# Patient Record
Sex: Male | Born: 1970
Health system: Southern US, Community
[De-identification: ages and names within clinical notes are randomized; demographics above are authoritative.]

## PROBLEM LIST (undated history)

## (undated) DIAGNOSIS — E785 Hyperlipidemia, unspecified: Secondary | ICD-10-CM

## (undated) DIAGNOSIS — K219 Gastro-esophageal reflux disease without esophagitis: Secondary | ICD-10-CM

## (undated) DIAGNOSIS — E119 Type 2 diabetes mellitus without complications: Secondary | ICD-10-CM

## (undated) HISTORY — DX: Type 2 diabetes mellitus without complications: E11.9

## (undated) HISTORY — PX: APPENDECTOMY: SHX54

## (undated) HISTORY — DX: Gastro-esophageal reflux disease without esophagitis: K21.9

## (undated) HISTORY — DX: Hyperlipidemia, unspecified: E78.5

## (undated) HISTORY — PX: VASECTOMY: SHX75

---

## 2004-11-06 ENCOUNTER — Ambulatory Visit: Payer: Self-pay | Admitting: Family Medicine

## 2010-07-18 ENCOUNTER — Emergency Department (HOSPITAL_BASED_OUTPATIENT_CLINIC_OR_DEPARTMENT_OTHER)
Admission: EM | Admit: 2010-07-18 | Discharge: 2010-07-18 | Disposition: A | Discharge status: Home / Self Care | Payer: PRIVATE HEALTH INSURANCE | Attending: Emergency Medicine | Admitting: Emergency Medicine

## 2010-07-18 ENCOUNTER — Emergency Department (INDEPENDENT_AMBULATORY_CARE_PROVIDER_SITE_OTHER): Payer: PRIVATE HEALTH INSURANCE

## 2010-07-18 DIAGNOSIS — R509 Fever, unspecified: Secondary | ICD-10-CM

## 2010-07-18 DIAGNOSIS — Z87891 Personal history of nicotine dependence: Secondary | ICD-10-CM

## 2010-07-18 DIAGNOSIS — R059 Cough, unspecified: Secondary | ICD-10-CM

## 2010-07-18 DIAGNOSIS — R05 Cough: Secondary | ICD-10-CM

## 2010-07-18 DIAGNOSIS — J189 Pneumonia, unspecified organism: Secondary | ICD-10-CM | POA: Insufficient documentation

## 2010-07-18 LAB — BASIC METABOLIC PANEL
BUN: 17 mg/dL (ref 6–23)
CO2: 20 mEq/L (ref 19–32)
Calcium: 9.4 mg/dL (ref 8.4–10.5)
Chloride: 101 mEq/L (ref 96–112)
Creatinine, Ser: 0.8 mg/dL (ref 0.4–1.5)
GFR calc Af Amer: >60 mL/min (ref >60)
GFR calc non Af Amer: >60 mL/min (ref >60)
Glucose, Bld: 134 mg/dL — ABNORMAL HIGH (ref 70–99)
Potassium: 3.6 mEq/L (ref 3.5–5.1)
Sodium: 136 mEq/L (ref 135–145)

## 2010-07-18 LAB — DIFFERENTIAL
Basophils Absolute: 0 10*3/uL (ref 0.0–0.1)
Basophils Relative: 0 % (ref 0–1)
Eosinophils Absolute: 0 10*3/uL (ref 0.0–0.7)
Eosinophils Relative: 0 % (ref 0–5)
Lymphocytes Relative: 10 % — ABNORMAL LOW (ref 12–46)
Lymphs Abs: 0.7 10*3/uL (ref 0.7–4.0)
Monocytes Absolute: 0.3 10*3/uL (ref 0.1–1.0)
Monocytes Relative: 4 % (ref 3–12)
Neutro Abs: 6 10*3/uL (ref 1.7–7.7)
Neutrophils Relative %: 85 % — ABNORMAL HIGH (ref 43–77)

## 2010-07-18 LAB — CBC
HCT: 47.3 % (ref 39.0–52.0)
Hemoglobin: 16.5 g/dL (ref 13.0–17.0)
MCH: 30.1 pg (ref 26.0–34.0)
MCHC: 34.9 g/dL (ref 30.0–36.0)
MCV: 86.2 fL (ref 78.0–100.0)
Platelets: 221 10*3/uL (ref 150–400)
RBC: 5.49 MIL/uL (ref 4.22–5.81)
RDW: 12.9 % (ref 11.5–15.5)
WBC: 7 10*3/uL (ref 4.0–10.5)

## 2011-01-31 ENCOUNTER — Emergency Department (HOSPITAL_BASED_OUTPATIENT_CLINIC_OR_DEPARTMENT_OTHER)
Admission: EM | Admit: 2011-01-31 | Discharge: 2011-02-01 | Disposition: A | Discharge status: Home / Self Care | Payer: PRIVATE HEALTH INSURANCE | Attending: Emergency Medicine | Admitting: Emergency Medicine

## 2011-01-31 ENCOUNTER — Emergency Department (INDEPENDENT_AMBULATORY_CARE_PROVIDER_SITE_OTHER): Payer: PRIVATE HEALTH INSURANCE

## 2011-01-31 ENCOUNTER — Encounter: Payer: Self-pay | Admitting: *Deleted

## 2011-01-31 DIAGNOSIS — R079 Chest pain, unspecified: Secondary | ICD-10-CM

## 2011-01-31 DIAGNOSIS — F17200 Nicotine dependence, unspecified, uncomplicated: Secondary | ICD-10-CM | POA: Insufficient documentation

## 2011-01-31 DIAGNOSIS — X58XXXA Exposure to other specified factors, initial encounter: Secondary | ICD-10-CM | POA: Insufficient documentation

## 2011-01-31 DIAGNOSIS — S299XXA Unspecified injury of thorax, initial encounter: Secondary | ICD-10-CM

## 2011-01-31 DIAGNOSIS — S2341XA Sprain of ribs, initial encounter: Secondary | ICD-10-CM | POA: Insufficient documentation

## 2011-01-31 NOTE — ED Notes (Signed)
Pt reports left side rib pain- pain started today around 1200 after pt coughed- ribs sore to touch

## 2011-02-01 MED ORDER — OXYCODONE-ACETAMINOPHEN 5-325 MG PO TABS
1.0000 | ORAL_TABLET | Freq: Once | ORAL | Status: AC
  Administered 2011-02-01: 1 via ORAL
  Filled 2011-02-01: qty 1

## 2011-02-01 MED ORDER — OXYCODONE HCL 10 MG PO TB12: ORAL_TABLET | ORAL | Status: DC

## 2011-02-01 NOTE — ED Provider Notes (Signed)
History     CSN: [id number]  Arrival date & time 01/31/11  2240   First MD Initiated Contact with Patient 02/01/11 [phone]      Chief Complaint  Patient presents with  . rib pain     (Consider location/radiation/quality/duration/timing/severity/associated sxs/prior treatment) HPI Is a 40 year old white male who was coughing to clear his throat yesterday about noon. He felt the sudden onset of a sharp, well localized pain in the left lower rib region. He points to an area about the anterior axillary line. He has had no symptoms of illness. He states he was coughing to clear his throat and has not been otherwise coughing. He states the pain is mild at rest but if he coughs or sneezes the pain is severe and feels like a knife being stabbed into his chest. He is not short of breath.  History reviewed. No pertinent past medical history.  Past Surgical History  Procedure Date  . Appendectomy     No family history on file.  History  Substance Use Topics  . Smoking status: Current Everyday Smoker -- 1.0 packs/day  . Smokeless tobacco: Never Used  . Alcohol Use: No      Review of Systems  All other systems reviewed and are negative.    Allergies  Review of patient's allergies indicates no known allergies.  Home Medications   Current Outpatient Rx  Name Route Sig Dispense Refill  . IBUPROFEN 200 MG PO TABS Oral Take 800 mg by mouth every 6 (six) hours as needed. For pain     . OMEPRAZOLE MAGNESIUM 20 MG PO TBEC Oral Take 20 mg by mouth daily as needed. For acid reflux       BP 156/83  Pulse 102  Temp(Src) 98.1 F (36.7 C) (Oral)  Resp 20  Ht 5\' 10"  (1.778 m)  Wt 220 lb (99.791 kg)  BMI 31.57 kg/m2  SpO2 96%  Physical Exam General: Well-developed, well-nourished male in no acute distress; appearance consistent with age of record HENT: normocephalic, atraumatic Eyes: Normal Neck: supple Heart: regular rate and rhythm Lungs: clear to auscultation  bilaterally Chest: Point tenderness left lower rib cage along the midaxillary line; no deformity or crepitus Abdomen: soft; nondistended Extremities: No deformity; full range of motion Neurologic: Awake, alert and oriented; motor function intact in all extremities and symmetric; no facial droop Skin: Warm and dry Psychiatric: Normal mood and affect    ED Course  Procedures (including critical care time)    MDM   Nursing notes and vitals signs, including pulse oximetry, reviewed.  Summary of this visit's results, reviewed by myself:   Imaging Studies: Dg Chest 2 View  01/31/2011  *RADIOLOGY REPORT*  Clinical Data: Left-sided chest pain.  CHEST - 2 VIEW  Comparison: Chest radiograph performed 07/18/2010  Findings: The lungs are well-aerated.  Mild chronic peribronchial thickening is noted.  There is no evidence of focal opacification, pleural effusion or pneumothorax.  The heart is borderline normal in size; the mediastinal contour is within normal limits.  No acute osseous abnormalities are seen.  IMPRESSION: Mild chronic peribronchial thickening noted; lungs otherwise grossly clear.  Original Report Authenticated By: Tonia Ghent, M.D.   2:20 AM Suspect occult fracture along the costochondral junction of a left lower rib.         Hanley Seamen, MD 02/01/11 [phone]

## 2013-04-12 IMAGING — CR DG CHEST 2V
2 series · 2 of 2 positions shown · non-contrast
Comparison: None.

CLINICAL DATA: Cough, fever, smoking history

CHEST - 2 VIEW

[w chest pa]
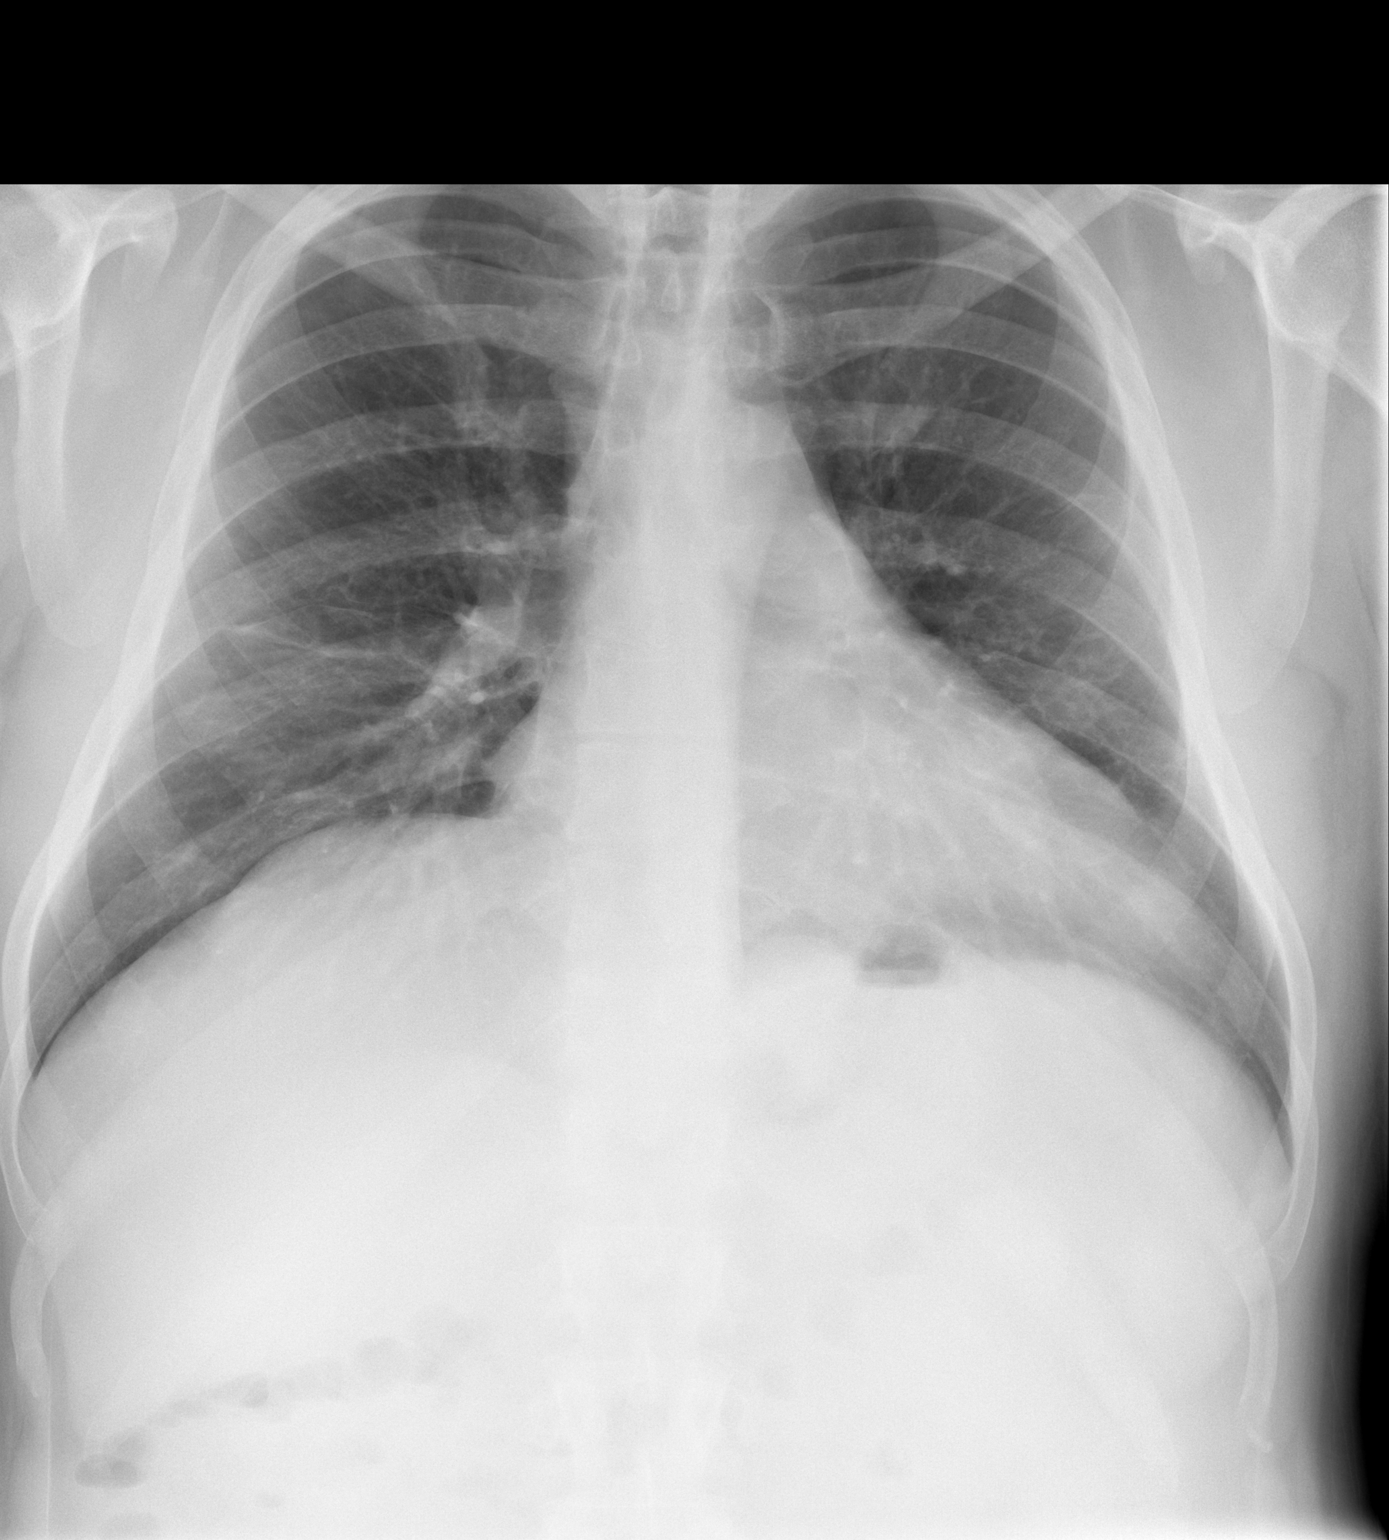

[w chest lat]
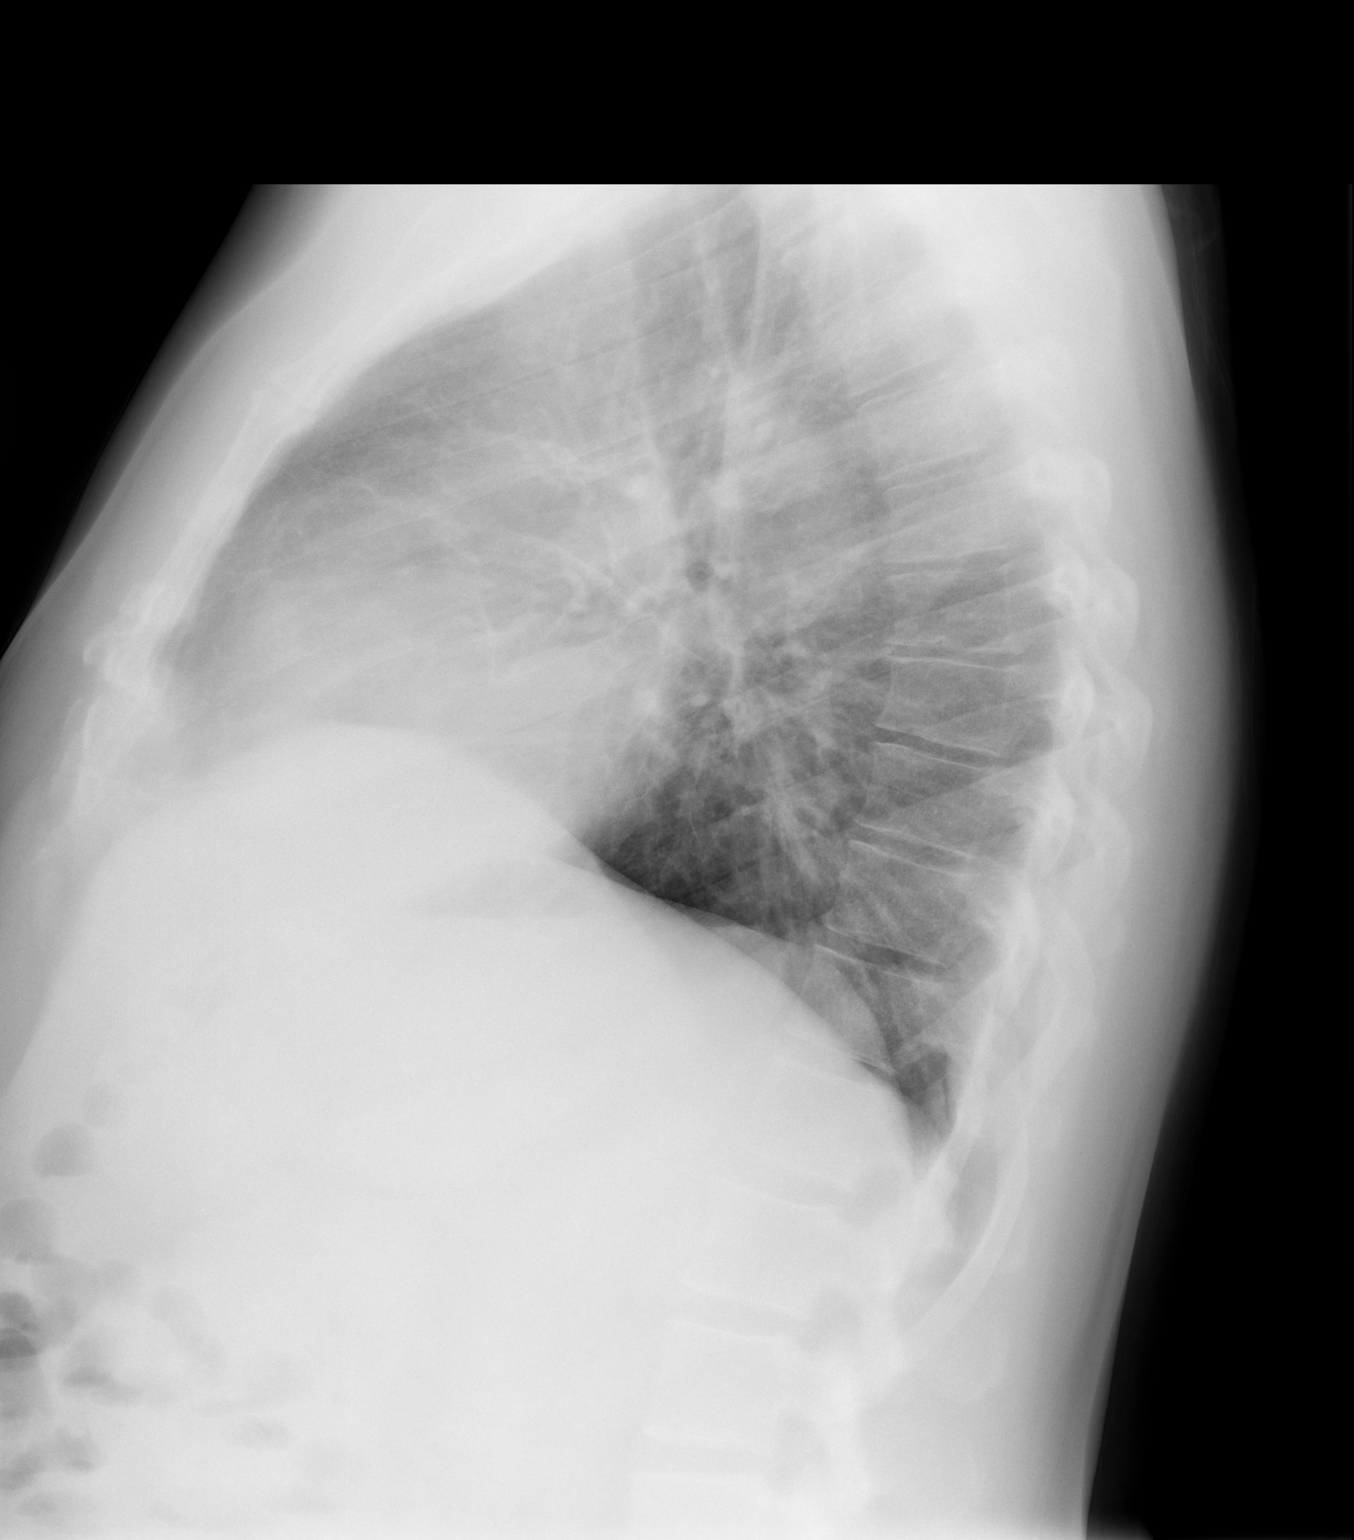

[2 of 2 positions shown; findings below may reference images not displayed]

FINDINGS: No pneumonia or effusion is seen.  Linear atelectasis or
scarring is noted at the right lung base.  There is some
peribronchial thickening which may indicate bronchitis.  The heart
is mildly enlarged.  No bony abnormality is seen.
IMPRESSION: No pneumonia.  Peribronchial thickening may indicate bronchitis.

## 2017-05-11 ENCOUNTER — Encounter: Payer: Self-pay | Admitting: Physician Assistant

## 2017-05-11 ENCOUNTER — Ambulatory Visit: Payer: 59 | Admitting: Physician Assistant

## 2017-05-11 ENCOUNTER — Other Ambulatory Visit: Payer: Self-pay | Admitting: Physician Assistant

## 2017-05-11 VITALS — BP 130/72 | HR 112 | Temp 97.2°F | Ht 70.0 in | Wt 187.8 lb

## 2017-05-11 DIAGNOSIS — Z0000 Encounter for general adult medical examination without abnormal findings: Secondary | ICD-10-CM

## 2017-05-11 DIAGNOSIS — R7309 Other abnormal glucose: Secondary | ICD-10-CM

## 2017-05-11 LAB — CBC WITH DIFFERENTIAL/PLATELET
Basophils Absolute: 0.1 10*3/uL (ref 0.0–0.2)
Basos: 1 %
EOS (ABSOLUTE): 0.1 10*3/uL (ref 0.0–0.4)
Eos: 2 %
Hematocrit: 48.5 % (ref 37.5–51.0)
Hemoglobin: 16.4 g/dL (ref 13.0–17.7)
Immature Grans (Abs): 0 10*3/uL (ref 0.0–0.1)
Immature Granulocytes: 0 %
Lymphocytes Absolute: 2.4 10*3/uL (ref 0.7–3.1)
Lymphs: 36 %
MCH: 30.3 pg (ref 26.6–33.0)
MCHC: 33.8 g/dL (ref 31.5–35.7)
MCV: 90 fL (ref 79–97)
Monocytes Absolute: 0.5 10*3/uL (ref 0.1–0.9)
Monocytes: 7 %
Neutrophils Absolute: 3.6 10*3/uL (ref 1.4–7.0)
Neutrophils: 54 %
Platelets: 195 10*3/uL (ref 150–379)
RBC: 5.42 x10E6/uL (ref 4.14–5.80)
RDW: 13.8 % (ref 12.3–15.4)
WBC: 6.7 10*3/uL (ref 3.4–10.8)

## 2017-05-11 LAB — LIPID PANEL
Chol/HDL Ratio: 6.4 ratio — ABNORMAL HIGH (ref 0.0–5.0)
Cholesterol, Total: 257 mg/dL — ABNORMAL HIGH (ref 100–199)
HDL: 40 mg/dL (ref >39)
LDL Calculated: 169 mg/dL — ABNORMAL HIGH (ref 0–99)
Triglycerides: 238 mg/dL — ABNORMAL HIGH (ref 0–149)
VLDL Cholesterol Cal: 48 mg/dL — ABNORMAL HIGH (ref 5–40)

## 2017-05-11 LAB — CMP14+EGFR
ALT: 22 IU/L (ref 0–44)
AST: 15 IU/L (ref 0–40)
Albumin/Globulin Ratio: 1.7 (ref 1.2–2.2)
Albumin: 4.5 g/dL (ref 3.5–5.5)
Alkaline Phosphatase: 62 IU/L (ref 39–117)
BUN/Creatinine Ratio: 12 (ref 9–20)
BUN: 10 mg/dL (ref 6–24)
Bilirubin Total: 0.4 mg/dL (ref 0.0–1.2)
CO2: 21 mmol/L (ref 20–29)
Calcium: 10.6 mg/dL — ABNORMAL HIGH (ref 8.7–10.2)
Chloride: 99 mmol/L (ref 96–106)
Creatinine, Ser: 0.84 mg/dL (ref 0.76–1.27)
GFR calc Af Amer: 121 mL/min/{1.73_m2} (ref >59)
GFR calc non Af Amer: 104 mL/min/{1.73_m2} (ref >59)
Globulin, Total: 2.6 g/dL (ref 1.5–4.5)
Glucose: 320 mg/dL — ABNORMAL HIGH (ref 65–99)
Potassium: 4.5 mmol/L (ref 3.5–5.2)
Sodium: 139 mmol/L (ref 134–144)
Total Protein: 7.1 g/dL (ref 6.0–8.5)

## 2017-05-11 LAB — BAYER DCA HB A1C WAIVED: HB A1C (BAYER DCA - WAIVED): 12.3 % — ABNORMAL HIGH (ref <7.0)

## 2017-05-11 MED ORDER — SITAGLIPTIN PHOS-METFORMIN HCL 50-1000 MG PO TABS: 1.0000 | ORAL_TABLET | Freq: Two times a day (BID) | ORAL | 2 refills | Status: DC

## 2017-05-11 MED ORDER — CEPHALEXIN 500 MG PO CAPS: 500.0000 mg | ORAL_CAPSULE | Freq: Three times a day (TID) | ORAL | 0 refills | Status: DC

## 2017-05-11 NOTE — Patient Instructions (Signed)
In a few days you may receive a survey in the mail or online from [employer] regarding your visit with us today. Please take a moment to fill this out. Your feedback is very important to our whole office. It can help us better understand your needs as well as improve your experience and satisfaction. Thank you for taking your time to complete it. We care about you.  Raelyn Racette, PA-C  

## 2017-05-11 NOTE — Progress Notes (Signed)
BP 130/72   Pulse (!) 112   Temp (!) 97.2 F (36.2 C) (Oral)   Ht _0  (1.778 m)   Wt 187 lb 12.8 oz (85.2 kg)   BMI 26.95 kg/m    Subjective:    Patient ID: Craig Allison, male    DOB: 06/17/1970, 47 y.o.   MRN: [id number]  HPI: Craig Allison is a 47 y.o. 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  The provider only gave him a 65-monthcard it will run out April 7.  He does need lab work performed to check on his glucose status.  He has never been diagnosed with diabetes is being health problem is GERD.  He has been trying to watch his diet and exercise.  And he has persistent slightly elevated heart rate around the 100s.   Past Medical History:  Diagnosis Date  . GERD (gastroesophageal reflux disease)    Relevant past medical, surgical, family and social history reviewed and updated as indicated. Interim medical history since our last visit reviewed. Allergies and medications reviewed and updated. DATA REVIEWED: CHART IN EPIC  Family History reviewed for pertinent findings.  Review of Systems  Constitutional: Negative.  Negative for appetite change and fatigue.  HENT: Negative.   Eyes: Negative.  Negative for pain and visual disturbance.  Respiratory: Negative.  Negative for cough, chest tightness, shortness of breath and wheezing.   Cardiovascular: Negative.  Negative for chest pain, palpitations and leg swelling.  Gastrointestinal: Negative.  Negative for abdominal pain, diarrhea, nausea and vomiting.  Endocrine: Negative.   Genitourinary: Negative.   Musculoskeletal: Negative.   Skin: Negative.  Negative for color change and rash.  Neurological: Negative.  Negative for weakness, numbness and headaches.  Psychiatric/Behavioral: Negative.     Allergies as of 05/11/2017   No Known Allergies     Medication List        Accurate as of 05/11/17   1:51 PM. Always use your most recent med list.          cephALEXin 500 MG capsule Commonly known as:  KEFLEX Take 1 capsule (500 mg total) by mouth 3 (three) times daily.   omeprazole 20 MG tablet Commonly known as:  PRILOSEC OTC Take 20 mg by mouth daily as needed. For acid reflux          Objective:    BP 130/72   Pulse (!) 112   Temp (!) 97.2 F (36.2 C) (Oral)   Ht _1  (1.778 m)   Wt 187 lb 12.8 oz (85.2 kg)   BMI 26.95 kg/m   No Known Allergies  Wt Readings from Last 3 Encounters:  05/11/17 187 lb 12.8 oz (85.2 kg)  01/31/11 220 lb (99.8 kg)    Physical Exam  Constitutional: He appears well-developed and well-nourished.  HENT:  Head: Normocephalic and atraumatic.  Eyes: Pupils are equal, round, and reactive to light. Conjunctivae and EOM are normal.  Neck: Normal range of motion. Neck supple.  Cardiovascular: Normal rate, regular rhythm and normal heart sounds.  Pulmonary/Chest: Effort normal and breath sounds normal.  Abdominal: Soft. Bowel sounds are normal.  Musculoskeletal: Normal range of motion.  Skin: Skin is warm and dry.  Nursing note and vitals reviewed.   Results for orders placed or performed in visit on 05/11/17  Bayer DCA Hb A1c Waived  Result Value  Ref Range   Bayer DCA Hb A1c Waived 12.3 (H) <7.0 %      Assessment & Plan:   1. Well adult exam - Bayer DCA Hb A1c Waived - CBC with Differential/Platelet - CMP14+EGFR - Lipid panel  2. Elevated glucose - Bayer DCA Hb A1c Waived   Continue all other maintenance medications as listed above.  Follow up plan: Return in about 3 months (around 08/10/2017) for recheck.  Educational handout given for [place] PA-C [place] [address]  [place], [place] [address] [phone]   05/11/2017, 1:51 PM

## 2017-05-20 ENCOUNTER — Ambulatory Visit: Payer: 59 | Admitting: Nurse Practitioner

## 2017-05-20 ENCOUNTER — Encounter: Payer: Self-pay | Admitting: Nurse Practitioner

## 2017-05-20 DIAGNOSIS — Z024 Encounter for examination for driving license: Secondary | ICD-10-CM

## 2017-05-20 NOTE — Addendum Note (Signed)
Addended by: Bennie PieriniMARTIN, MARY-MARGARET on: 05/20/2017 04:04 PM   Modules accepted: Level of Service

## 2017-05-20 NOTE — Progress Notes (Signed)
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

## 2017-06-11 ENCOUNTER — Encounter: Payer: Self-pay | Admitting: Physician Assistant

## 2017-06-11 ENCOUNTER — Ambulatory Visit: Payer: 59 | Admitting: Physician Assistant

## 2017-06-11 DIAGNOSIS — E119 Type 2 diabetes mellitus without complications: Secondary | ICD-10-CM

## 2017-06-11 DIAGNOSIS — E1169 Type 2 diabetes mellitus with other specified complication: Secondary | ICD-10-CM | POA: Insufficient documentation

## 2017-06-11 MED ORDER — SITAGLIPTIN PHOS-METFORMIN HCL 50-1000 MG PO TABS: 1.0000 | ORAL_TABLET | Freq: Two times a day (BID) | ORAL | 1 refills | Status: DC

## 2017-06-11 NOTE — Patient Instructions (Signed)
In a few days you may receive a survey in the mail or online from [employer] regarding your visit with us today. Please take a moment to fill this out. Your feedback is very important to our whole office. It can help us better understand your needs as well as improve your experience and satisfaction. Thank you for taking your time to complete it. We care about you.  Kishawn Pickar, PA-C  

## 2017-06-13 NOTE — Progress Notes (Signed)
BP 112/80   Pulse (!) 102   Ht _0  (1.778 m)   Wt 184 lb 3.2 oz (83.6 kg)   BMI 26.43 kg/m    Subjective:    Patient ID: Craig Allison, male    DOB: 08/11/1970, 47 y.o.   MRN: [id number]  HPI: Craig Allison is a 47 y.o. male presenting on 06/11/2017 for Diabetes (1 month )  This patient comes in for 1 month recheck on his diabetes.  He has had excellent numbers for fasting blood sugar since starting his medication and beginning to watch his diet.  His fastings are between 95 and 115.  The high as he is seeing later in the day is 150.  He has greatly reduced his carb intake.  He is tolerating his medication well.  He is also stopped drinking soda altogether.  I have commended him on his efforts.  We will have him continue and to recheck in 2 months.  We should be able to go on and work on his DOT exam at this time.  Past Medical History:  Diagnosis Date  . GERD (gastroesophageal reflux disease)    Relevant past medical, surgical, family and social history reviewed and updated as indicated. Interim medical history since our last visit reviewed. Allergies and medications reviewed and updated. DATA REVIEWED: CHART IN EPIC  Family History reviewed for pertinent findings.  Review of Systems  Constitutional: Negative.  Negative for appetite change and fatigue.  HENT: Negative.   Eyes: Negative.  Negative for pain and visual disturbance.  Respiratory: Negative.  Negative for cough, chest tightness, shortness of breath and wheezing.   Cardiovascular: Negative.  Negative for chest pain, palpitations and leg swelling.  Gastrointestinal: Negative.  Negative for abdominal pain, diarrhea, nausea and vomiting.  Endocrine: Negative.   Genitourinary: Negative.   Musculoskeletal: Negative.   Skin: Negative.  Negative for color change and rash.  Neurological: Negative.  Negative for weakness, numbness and headaches.  Psychiatric/Behavioral: Negative.     Allergies as of 06/11/2017     No Known Allergies     Medication List        Accurate as of 06/11/17 11:59 PM. Always use your most recent med list.          omeprazole 20 MG tablet Commonly known as:  PRILOSEC OTC Take 20 mg by mouth daily as needed. For acid reflux   sitaGLIPtin-metformin 50-1000 MG tablet Commonly known as:  JANUMET Take 1 tablet by mouth 2 (two) times daily with a meal.          Objective:    BP 112/80   Pulse (!) 102   Ht _1  (1.778 m)   Wt 184 lb 3.2 oz (83.6 kg)   BMI 26.43 kg/m   No Known Allergies  Wt Readings from Last 3 Encounters:  06/11/17 184 lb 3.2 oz (83.6 kg)  05/11/17 187 lb 12.8 oz (85.2 kg)  01/31/11 220 lb (99.8 kg)    Physical Exam  Constitutional: He appears well-developed and well-nourished. No distress.  HENT:  Head: Normocephalic and atraumatic.  Eyes: Pupils are equal, round, and reactive to light. Conjunctivae and EOM are normal.  Cardiovascular: Normal rate, regular rhythm and normal heart sounds.  Pulmonary/Chest: Effort normal and breath sounds normal. No respiratory distress.  Skin: Skin is warm and dry.  Psychiatric: He has a normal mood and affect. His behavior is normal.  Nursing note and vitals reviewed.   Results for orders  placed or performed in visit on 05/11/17  Bayer DCA Hb A1c Waived  Result Value Ref Range   Bayer DCA Hb A1c Waived 12.3 (H) <7.0 %  CBC with Differential/Platelet  Result Value Ref Range   WBC 6.7 3.4 - 10.8 x10E3/uL   RBC 5.42 4.14 - 5.80 x10E6/uL   Hemoglobin 16.4 13.0 - 17.7 g/dL   Hematocrit 48.5 37.5 - 51.0 %   MCV 90 79 - 97 fL   MCH 30.3 26.6 - 33.0 pg   MCHC 33.8 31.5 - 35.7 g/dL   RDW 13.8 12.3 - 15.4 %   Platelets 195 150 - 379 x10E3/uL   Neutrophils 54 Not Estab. %   Lymphs 36 Not Estab. %   Monocytes 7 Not Estab. %   Eos 2 Not Estab. %   Basos 1 Not Estab. %   Neutrophils Absolute 3.6 1.4 - 7.0 x10E3/uL   Lymphocytes Absolute 2.4 0.7 - 3.1 x10E3/uL   Monocytes Absolute 0.5 0.1 - 0.9  x10E3/uL   EOS (ABSOLUTE) 0.1 0.0 - 0.4 x10E3/uL   Basophils Absolute 0.1 0.0 - 0.2 x10E3/uL   Immature Granulocytes 0 Not Estab. %   Immature Grans (Abs) 0.0 0.0 - 0.1 x10E3/uL  CMP14+EGFR  Result Value Ref Range   Glucose 320 (H) 65 - 99 mg/dL   BUN 10 6 - 24 mg/dL   Creatinine, Ser 0.84 0.76 - 1.27 mg/dL   GFR calc non Af Amer 104 >59 mL/min/1.73   GFR calc Af Amer 121 >59 mL/min/1.73   BUN/Creatinine Ratio 12 9 - 20   Sodium 139 134 - 144 mmol/L   Potassium 4.5 3.5 - 5.2 mmol/L   Chloride 99 96 - 106 mmol/L   CO2 21 20 - 29 mmol/L   Calcium 10.6 (H) 8.7 - 10.2 mg/dL   Total Protein 7.1 6.0 - 8.5 g/dL   Albumin 4.5 3.5 - 5.5 g/dL   Globulin, Total 2.6 1.5 - 4.5 g/dL   Albumin/Globulin Ratio 1.7 1.2 - 2.2   Bilirubin Total 0.4 0.0 - 1.2 mg/dL   Alkaline Phosphatase 62 39 - 117 IU/L   AST 15 0 - 40 IU/L   ALT 22 0 - 44 IU/L  Lipid panel  Result Value Ref Range   Cholesterol, Total 257 (H) 100 - 199 mg/dL   Triglycerides 238 (H) 0 - 149 mg/dL   HDL 40 >39 mg/dL   VLDL Cholesterol Cal 48 (H) 5 - 40 mg/dL   LDL Calculated 169 (H) 0 - 99 mg/dL   Chol/HDL Ratio 6.4 (H) 0.0 - 5.0 ratio      Assessment & Plan:   1. Diabetes mellitus without complication ([place])   Continue all other maintenance medications as listed above.  Follow up plan: Return in about 4 months (around 10/12/2017) for recheck and labs.  Educational handout given for [place] PA-C [place] [address]  [place], [place] [address] [phone]   06/13/2017, 10:28 PM

## 2017-07-21 ENCOUNTER — Ambulatory Visit: Payer: Self-pay

## 2017-07-21 ENCOUNTER — Other Ambulatory Visit: Payer: Self-pay | Admitting: Occupational Medicine

## 2017-07-21 DIAGNOSIS — Z0000 Encounter for general adult medical examination without abnormal findings: Secondary | ICD-10-CM

## 2017-10-27 ENCOUNTER — Ambulatory Visit: Payer: 59 | Admitting: Physician Assistant

## 2017-10-29 ENCOUNTER — Ambulatory Visit: Payer: 59 | Admitting: Physician Assistant

## 2018-02-23 ENCOUNTER — Ambulatory Visit (INDEPENDENT_AMBULATORY_CARE_PROVIDER_SITE_OTHER): Payer: BLUE CROSS/BLUE SHIELD | Admitting: Family Medicine

## 2018-02-23 ENCOUNTER — Encounter: Payer: Self-pay | Admitting: Family Medicine

## 2018-02-23 VITALS — BP 134/78 | HR 103 | Temp 97.7°F | Ht 70.0 in | Wt 198.0 lb

## 2018-02-23 DIAGNOSIS — E0869 Diabetes mellitus due to underlying condition with other specified complication: Secondary | ICD-10-CM

## 2018-02-23 DIAGNOSIS — E785 Hyperlipidemia, unspecified: Secondary | ICD-10-CM | POA: Diagnosis not present

## 2018-02-23 DIAGNOSIS — E1169 Type 2 diabetes mellitus with other specified complication: Secondary | ICD-10-CM

## 2018-02-23 LAB — BAYER DCA HB A1C WAIVED: HB A1C (BAYER DCA - WAIVED): 7.4 % — ABNORMAL HIGH (ref <7.0)

## 2018-02-23 NOTE — Progress Notes (Signed)
BP 134/78   Pulse (!) 103   Temp 97.7 F (36.5 C) (Oral)   Ht '5\' 10"'$  (1.778 m)   Wt 198 lb (89.8 kg)   BMI 28.41 kg/m    Subjective:    Patient ID: Craig Allison, male    DOB: 04/22/1970, 48 y.o.   MRN: [id number]  HPI: Craig Allison is a 49 y.o. male presenting on 02/23/2018 for Diabetes (check up of chronic medical conditions)   HPI Type 2 diabetes mellitus Patient comes in today for recheck of his diabetes. Patient has been currently taking no medication because he stopped his Janumet but he does say he has been using some of his sisters metformin. Patient is not currently on an ACE inhibitor/ARB. Patient has not seen an ophthalmologist this year. Patient denies any issues with their feet.  His last A1c was almost a year ago and it was 10.4 but he says that he has stopped drinking [facility] since that time and thinks his blood sugars are running better  Hyperlipidemia Patient is coming in for recheck of his hyperlipidemia. The patient is currently taking no medication currently but his cholesterol was up last time this was almost a year ago and we will recheck it today.. They deny any issues with myalgias or history of liver damage from it. They deny any focal numbness or weakness or chest pain.   Patient is having some erectile dysfunction, we will monitor his blood work and get his diabetes under control and then we will consider medications after that if he still having a problem.  Relevant past medical, surgical, family and social history reviewed and updated as indicated. Interim medical history since our last visit reviewed. Allergies and medications reviewed and updated.  Review of Systems  Constitutional: Negative for chills and fever.  Respiratory: Negative for shortness of breath and wheezing.   Cardiovascular: Negative for chest pain and leg swelling.  Musculoskeletal: Negative for back pain and gait problem.  Skin: Negative for rash.  Neurological: Negative  for dizziness, weakness and light-headedness.  All other systems reviewed and are negative.   Per HPI unless specifically indicated above   Allergies as of 02/23/2018   No Known Allergies     Medication List       Accurate as of February 23, 2018  2:31 PM. Always use your most recent med list.        omeprazole 20 MG tablet Commonly known as:  PRILOSEC OTC Take 20 mg by mouth daily as needed. For acid reflux          Objective:    BP 134/78   Pulse (!) 103   Temp 97.7 F (36.5 C) (Oral)   Ht '5\' 10"'$  (1.778 m)   Wt 198 lb (89.8 kg)   BMI 28.41 kg/m   Wt Readings from Last 3 Encounters:  02/23/18 198 lb (89.8 kg)  06/11/17 184 lb 3.2 oz (83.6 kg)  05/11/17 187 lb 12.8 oz (85.2 kg)    Physical Exam Vitals signs and nursing note reviewed.  Constitutional:      General: He is not in acute distress.    Appearance: He is well-developed. He is not diaphoretic.  Eyes:     General: No scleral icterus.    Conjunctiva/sclera: Conjunctivae normal.  Neck:     Musculoskeletal: Neck supple.     Thyroid: No thyromegaly.  Cardiovascular:     Rate and Rhythm: Normal rate and regular rhythm.  Heart sounds: Normal heart sounds. No murmur.  Pulmonary:     Effort: Pulmonary effort is normal. No respiratory distress.     Breath sounds: Normal breath sounds. No wheezing.  Musculoskeletal: Normal range of motion.  Lymphadenopathy:     Cervical: No cervical adenopathy.  Skin:    General: Skin is warm and dry.     Findings: No rash.  Neurological:     Mental Status: He is alert and oriented to person, place, and time.     Coordination: Coordination normal.  Psychiatric:        Behavior: Behavior normal.        Assessment & Plan:   Problem List Items Addressed This Visit      Endocrine   Diabetes mellitus without complication ([place]) - Primary   Hyperlipidemia associated with type 2 diabetes mellitus ([place])   Relevant Orders   Lipid panel       Follow up  plan: Return in about 3 months (around 05/25/2018), or if symptoms worsen or fail to improve, for Diabetes recheck.  Counseling provided for all of the vaccine components Orders Placed This Encounter  Procedures  . CBC with Differential/Platelet  . CMP14+EGFR  . Lipid panel  . Bayer [facility] Hb A1c [place], MD [facility] 02/23/2018, 2:31 PM

## 2018-02-24 LAB — CBC WITH DIFFERENTIAL/PLATELET
Basophils Absolute: 0.1 10*3/uL (ref 0.0–0.2)
Basos: 1 %
EOS (ABSOLUTE): 0.2 10*3/uL (ref 0.0–0.4)
Eos: 2 %
Hematocrit: 44.7 % (ref 37.5–51.0)
Hemoglobin: 15.5 g/dL (ref 13.0–17.7)
Immature Grans (Abs): 0 10*3/uL (ref 0.0–0.1)
Immature Granulocytes: 0 %
Lymphocytes Absolute: 2.8 10*3/uL (ref 0.7–3.1)
Lymphs: 31 %
MCH: 30.5 pg (ref 26.6–33.0)
MCHC: 34.7 g/dL (ref 31.5–35.7)
MCV: 88 fL (ref 79–97)
Monocytes Absolute: 0.6 10*3/uL (ref 0.1–0.9)
Monocytes: 7 %
Neutrophils Absolute: 5.1 10*3/uL (ref 1.4–7.0)
Neutrophils: 59 %
Platelets: 204 10*3/uL (ref 150–450)
RBC: 5.08 x10E6/uL (ref 4.14–5.80)
RDW: 12.4 % (ref 11.6–15.4)
WBC: 8.8 10*3/uL (ref 3.4–10.8)

## 2018-02-24 LAB — LIPID PANEL
Chol/HDL Ratio: 5.1 ratio — ABNORMAL HIGH (ref 0.0–5.0)
Cholesterol, Total: 221 mg/dL — ABNORMAL HIGH (ref 100–199)
HDL: 43 mg/dL (ref >39)
LDL Calculated: 148 mg/dL — ABNORMAL HIGH (ref 0–99)
Triglycerides: 151 mg/dL — ABNORMAL HIGH (ref 0–149)
VLDL Cholesterol Cal: 30 mg/dL (ref 5–40)

## 2018-02-24 LAB — CMP14+EGFR
ALT: 38 IU/L (ref 0–44)
AST: 22 IU/L (ref 0–40)
Albumin/Globulin Ratio: 2 (ref 1.2–2.2)
Albumin: 4.7 g/dL (ref 3.5–5.5)
Alkaline Phosphatase: 44 IU/L (ref 39–117)
BUN/Creatinine Ratio: 17 (ref 9–20)
BUN: 13 mg/dL (ref 6–24)
Bilirubin Total: 0.7 mg/dL (ref 0.0–1.2)
CO2: 19 mmol/L — ABNORMAL LOW (ref 20–29)
Calcium: 9.8 mg/dL (ref 8.7–10.2)
Chloride: 105 mmol/L (ref 96–106)
Creatinine, Ser: 0.76 mg/dL (ref 0.76–1.27)
GFR calc Af Amer: 126 mL/min/{1.73_m2} (ref >59)
GFR calc non Af Amer: 109 mL/min/{1.73_m2} (ref >59)
Globulin, Total: 2.3 g/dL (ref 1.5–4.5)
Glucose: 145 mg/dL — ABNORMAL HIGH (ref 65–99)
Potassium: 4.2 mmol/L (ref 3.5–5.2)
Sodium: 141 mmol/L (ref 134–144)
Total Protein: 7 g/dL (ref 6.0–8.5)

## 2018-02-28 ENCOUNTER — Telehealth: Payer: Self-pay | Admitting: Physician Assistant

## 2018-02-28 MED ORDER — METFORMIN HCL 500 MG PO TABS: 500.0000 mg | ORAL_TABLET | Freq: Two times a day (BID) | ORAL | 1 refills | Status: DC

## 2018-02-28 MED ORDER — PRAVASTATIN SODIUM 20 MG PO TABS: 20.0000 mg | ORAL_TABLET | Freq: Every day | ORAL | 1 refills | Status: DC

## 2018-02-28 NOTE — Telephone Encounter (Signed)
Pt aware of labs  

## 2018-05-25 ENCOUNTER — Ambulatory Visit: Payer: BLUE CROSS/BLUE SHIELD | Admitting: Physician Assistant

## 2018-05-27 ENCOUNTER — Other Ambulatory Visit: Payer: Self-pay

## 2018-05-27 ENCOUNTER — Ambulatory Visit (INDEPENDENT_AMBULATORY_CARE_PROVIDER_SITE_OTHER): Payer: BLUE CROSS/BLUE SHIELD | Admitting: Family Medicine

## 2018-05-27 ENCOUNTER — Encounter: Payer: Self-pay | Admitting: Family Medicine

## 2018-05-27 DIAGNOSIS — K219 Gastro-esophageal reflux disease without esophagitis: Secondary | ICD-10-CM | POA: Diagnosis not present

## 2018-05-27 DIAGNOSIS — N529 Male erectile dysfunction, unspecified: Secondary | ICD-10-CM

## 2018-05-27 DIAGNOSIS — E119 Type 2 diabetes mellitus without complications: Secondary | ICD-10-CM | POA: Diagnosis not present

## 2018-05-27 DIAGNOSIS — E785 Hyperlipidemia, unspecified: Secondary | ICD-10-CM

## 2018-05-27 DIAGNOSIS — E1169 Type 2 diabetes mellitus with other specified complication: Secondary | ICD-10-CM | POA: Diagnosis not present

## 2018-05-27 MED ORDER — SITAGLIPTIN PHOS-METFORMIN HCL 50-1000 MG PO TABS: 1.0000 | ORAL_TABLET | Freq: Two times a day (BID) | ORAL | 3 refills | Status: DC

## 2018-05-27 MED ORDER — SILDENAFIL CITRATE 20 MG PO TABS: 20.0000 mg | ORAL_TABLET | ORAL | 1 refills | Status: DC | PRN

## 2018-05-27 NOTE — Progress Notes (Signed)
Virtual Visit via telephone Note  I connected with IHAB WOLTZ on 05/27/18 at 1558 by telephone and verified that I am speaking with the correct person using two identifiers. ROCKLAN GABER is currently located at home and no other people are currently with her during visit. The provider, Elige Radon Dettinger, MD is located in their office at time of visit.  Call ended at 1610  I discussed the limitations, risks, security and privacy concerns of performing an evaluation and management service by telephone and the availability of in person appointments. I also discussed with the patient that there may be a patient responsible charge related to this service. The patient expressed understanding and agreed to proceed.   History and Present Illness: Type 2 diabetes mellitus Patient comes in today for recheck of his diabetes. Patient has been currently taking metformin and his sugar has been up in the 170s over the month. Patient is not currently on an ACE inhibitor/ARB. Patient has not seen an ophthalmologist this year. Patient denies any issues with their feet.   Hyperlipidemia Patient is coming in for recheck of his hyperlipidemia. The patient is currently taking pravastatin. They deny any issues with myalgias or history of liver damage from it. They deny any focal numbness or weakness or chest pain.   GERD Patient is currently on omeprazole.  She denies any major symptoms or abdominal pain or belching or burping. She denies any blood in her stool or lightheadedness or dizziness.   No diagnosis found.  Outpatient Encounter Medications as of 05/27/2018  Medication Sig  . metFORMIN (GLUCOPHAGE) 500 MG tablet Take 1 tablet (500 mg total) by mouth 2 (two) times daily with a meal.  . omeprazole (PRILOSEC OTC) 20 MG tablet Take 20 mg by mouth daily as needed. For acid reflux   . pravastatin (PRAVACHOL) 20 MG tablet Take 1 tablet (20 mg total) by mouth daily with supper.   No  facility-administered encounter medications on file as of 05/27/2018.     Review of Systems  Constitutional: Negative for chills and fever.  Eyes: Negative for visual disturbance.  Respiratory: Negative for shortness of breath and wheezing.   Cardiovascular: Negative for chest pain and leg swelling.  Gastrointestinal: Negative for abdominal pain and nausea.  Genitourinary: Negative for discharge, penile pain and penile swelling.  Musculoskeletal: Negative for back pain and gait problem.  Skin: Negative for rash.  Neurological: Negative for dizziness, weakness and light-headedness.  All other systems reviewed and are negative.   Observations/Objective: Patient sounds comfortable and in no acute distress  Assessment and Plan: Problem List Items Addressed This Visit      Digestive   GERD (gastroesophageal reflux disease)     Endocrine   Diabetes mellitus without complication ([facility]) - Primary   Relevant Medications   sitaGLIPtin-metformin (JANUMET) 50-1000 MG tablet   Hyperlipidemia associated with type 2 diabetes mellitus ([facility])   Relevant Medications   sitaGLIPtin-metformin (JANUMET) 50-1000 MG tablet    Other Visit Diagnoses    Erectile dysfunction, unspecified erectile dysfunction type       Relevant Medications   sildenafil (REVATIO) 20 MG tablet       Follow Up Instructions: Sugar has been up recently will change to janumet from metformin and follow up in 3 months.    I discussed the assessment and treatment plan with the patient. The patient was provided an opportunity to ask questions and all were answered. The patient agreed with the plan and demonstrated an understanding  of the instructions.   The patient was advised to call back or seek an in-person evaluation if the symptoms worsen or if the condition fails to improve as anticipated.  The above assessment and management plan was discussed with the patient. The patient verbalized understanding of and has agreed to  the management plan. Patient is aware to call the clinic if symptoms persist or worsen. Patient is aware when to return to the clinic for a follow-up visit. Patient educated on when it is appropriate to go to the emergency department.    I provided 12 minutes of non-face-to-face time during this encounter.    Nils PyleJoshua A Dettinger, MD

## 2018-06-29 ENCOUNTER — Telehealth: Payer: Self-pay | Admitting: Family Medicine

## 2018-06-29 NOTE — Telephone Encounter (Signed)
LM pt needs to be scheduled for 3 month recheck after 7/17 w/ PCP

## 2018-08-24 ENCOUNTER — Other Ambulatory Visit: Payer: Self-pay

## 2018-08-24 DIAGNOSIS — Z20822 Contact with and (suspected) exposure to covid-19: Secondary | ICD-10-CM

## 2018-08-24 DIAGNOSIS — Z20828 Contact with and (suspected) exposure to other viral communicable diseases: Secondary | ICD-10-CM

## 2018-08-26 ENCOUNTER — Other Ambulatory Visit: Payer: Self-pay | Admitting: Family Medicine

## 2018-08-27 LAB — NOVEL CORONAVIRUS, NAA: SARS-CoV-2, NAA: NOT DETECTED

## 2018-08-30 ENCOUNTER — Ambulatory Visit: Payer: BLUE CROSS/BLUE SHIELD | Admitting: Family Medicine

## 2018-10-05 ENCOUNTER — Encounter: Payer: Self-pay | Admitting: Family Medicine

## 2018-10-05 ENCOUNTER — Ambulatory Visit (INDEPENDENT_AMBULATORY_CARE_PROVIDER_SITE_OTHER): Payer: BC Managed Care – PPO | Admitting: Family Medicine

## 2018-10-05 DIAGNOSIS — K219 Gastro-esophageal reflux disease without esophagitis: Secondary | ICD-10-CM | POA: Diagnosis not present

## 2018-10-05 DIAGNOSIS — E119 Type 2 diabetes mellitus without complications: Secondary | ICD-10-CM | POA: Diagnosis not present

## 2018-10-05 DIAGNOSIS — L98499 Non-pressure chronic ulcer of skin of other sites with unspecified severity: Secondary | ICD-10-CM

## 2018-10-05 DIAGNOSIS — E1169 Type 2 diabetes mellitus with other specified complication: Secondary | ICD-10-CM

## 2018-10-05 DIAGNOSIS — I771 Stricture of artery: Secondary | ICD-10-CM | POA: Diagnosis not present

## 2018-10-05 DIAGNOSIS — E785 Hyperlipidemia, unspecified: Secondary | ICD-10-CM

## 2018-10-05 DIAGNOSIS — N529 Male erectile dysfunction, unspecified: Secondary | ICD-10-CM

## 2018-10-05 MED ORDER — JANUMET XR 50-1000 MG PO TB24: 1.0000 | ORAL_TABLET | Freq: Two times a day (BID) | ORAL | 3 refills | Status: DC

## 2018-10-05 MED ORDER — PRAVASTATIN SODIUM 20 MG PO TABS: 20.0000 mg | ORAL_TABLET | Freq: Every day | ORAL | 3 refills | Status: DC

## 2018-10-05 MED ORDER — SILDENAFIL CITRATE 20 MG PO TABS: 20.0000 mg | ORAL_TABLET | ORAL | 1 refills | Status: DC | PRN

## 2018-10-05 NOTE — Progress Notes (Signed)
Virtual Visit via telephone Note   I connected with Craig Allison on 10/05/18 at 1518 by telephone and verified that I am speaking with the correct person using two identifiers. Craig Allison is currently located at home and no other people are currently with her during visit. The provider, Fransisca Kaufmann Yuritzy Zehring, MD is located in their office at time of visit.  Call ended at 1530  I discussed the limitations, risks, security and privacy concerns of performing an evaluation and management service by telephone and the availability of in person appointments. I also discussed with the patient that there may be a patient responsible charge related to this service. The patient expressed understanding and agreed to proceed.   History and Present Illness: Type 2 diabetes mellitus Patient comes in today for recheck of his diabetes. Patient has been currently taking janumet, and bs has been 120-130 and was around 180 for a while. Patient is not currently on an ACE inhibitor/ARB. Patient has not seen an ophthalmologist this year. Patient denies any issues with their feet. He does have swelling in his legs sometimes and sometimes gets sore on his legs. He is able to heal them eventually.   Hyperlipidemia Patient is coming in for recheck of his hyperlipidemia. The patient is currently taking pravastatin. They deny any issues with myalgias or history of liver damage from it. They deny any focal numbness or weakness or chest pain.   GERD Patient is currently on omeprazole.  She denies any major symptoms or abdominal pain or belching or burping. She denies any blood in her stool or lightheadedness or dizziness.   No diagnosis found.  Outpatient Encounter Medications as of 10/05/2018  Medication Sig  . omeprazole (PRILOSEC OTC) 20 MG tablet Take 20 mg by mouth daily as needed. For acid reflux   . pravastatin (PRAVACHOL) 20 MG tablet TAKE 1 TABLET (20 MG TOTAL) BY MOUTH DAILY WITH SUPPER.  . sildenafil  (REVATIO) 20 MG tablet Take 1-3 tablets (20-60 mg total) by mouth as needed.  . sitaGLIPtin-metformin (JANUMET) 50-1000 MG tablet Take 1 tablet by mouth 2 (two) times daily with a meal.   No facility-administered encounter medications on file as of 10/05/2018.     Review of Systems  Constitutional: Negative for chills and fever.  Respiratory: Negative for shortness of breath and wheezing.   Cardiovascular: Negative for chest pain and leg swelling.  Musculoskeletal: Negative for back pain and gait problem.  Skin: Negative for rash.  Neurological: Negative for dizziness, weakness and light-headedness.  All other systems reviewed and are negative.   Observations/Objective: Patient sounds comfortable and in no acute distress  Assessment and Plan: Problem List Items Addressed This Visit      Digestive   GERD (gastroesophageal reflux disease)   Relevant Orders   CBC with Differential/Platelet     Endocrine   Diabetes mellitus without complication (HCC)   Relevant Medications   SitaGLIPtin-MetFORMIN HCl (JANUMET XR) 50-1000 MG TB24   pravastatin (PRAVACHOL) 20 MG tablet   Other Relevant Orders   Bayer DCA Hb A1c Waived   CMP14+EGFR   Hyperlipidemia associated with type 2 diabetes mellitus ([place]) - Primary   Relevant Medications   SitaGLIPtin-MetFORMIN HCl (JANUMET XR) 50-1000 MG TB24   pravastatin (PRAVACHOL) 20 MG tablet   Other Relevant Orders   Lipid panel    Other Visit Diagnoses    Arterial insufficiency with ischemic ulcer ([place])       Relevant Medications   pravastatin (PRAVACHOL) 20  MG tablet   sildenafil (REVATIO) 20 MG tablet   Other Relevant Orders   Ambulatory referral to Vascular Surgery   Erectile dysfunction, unspecified erectile dysfunction type       Relevant Medications   sildenafil (REVATIO) 20 MG tablet       Follow Up Instructions:  3 months recheck No medication changes   I discussed the assessment and treatment plan with the patient. The  patient was provided an opportunity to ask questions and all were answered. The patient agreed with the plan and demonstrated an understanding of the instructions.   The patient was advised to call back or seek an in-person evaluation if the symptoms worsen or if the condition fails to improve as anticipated.  The above assessment and management plan was discussed with the patient. The patient verbalized understanding of and has agreed to the management plan. Patient is aware to call the clinic if symptoms persist or worsen. Patient is aware when to return to the clinic for a follow-up visit. Patient educated on when it is appropriate to go to the emergency department.    I provided 12 minutes of non-face-to-face time during this encounter.    Worthy Rancher, MD

## 2018-10-10 ENCOUNTER — Other Ambulatory Visit: Payer: Self-pay

## 2018-10-10 ENCOUNTER — Other Ambulatory Visit: Payer: BC Managed Care – PPO

## 2018-10-10 DIAGNOSIS — K219 Gastro-esophageal reflux disease without esophagitis: Secondary | ICD-10-CM

## 2018-10-10 DIAGNOSIS — E119 Type 2 diabetes mellitus without complications: Secondary | ICD-10-CM | POA: Diagnosis not present

## 2018-10-10 DIAGNOSIS — E1169 Type 2 diabetes mellitus with other specified complication: Secondary | ICD-10-CM

## 2018-10-10 DIAGNOSIS — E785 Hyperlipidemia, unspecified: Secondary | ICD-10-CM

## 2018-10-10 LAB — BAYER DCA HB A1C WAIVED: HB A1C (BAYER DCA - WAIVED): 8.8 % — ABNORMAL HIGH (ref <7.0)

## 2018-10-11 LAB — CBC WITH DIFFERENTIAL/PLATELET
Basophils Absolute: 0.1 10*3/uL (ref 0.0–0.2)
Basos: 1 %
EOS (ABSOLUTE): 0.5 10*3/uL — ABNORMAL HIGH (ref 0.0–0.4)
Eos: 6 %
Hematocrit: 47.1 % (ref 37.5–51.0)
Hemoglobin: 15.8 g/dL (ref 13.0–17.7)
Immature Grans (Abs): 0 10*3/uL (ref 0.0–0.1)
Immature Granulocytes: 0 %
Lymphocytes Absolute: 2.9 10*3/uL (ref 0.7–3.1)
Lymphs: 35 %
MCH: 30.3 pg (ref 26.6–33.0)
MCHC: 33.5 g/dL (ref 31.5–35.7)
MCV: 90 fL (ref 79–97)
Monocytes Absolute: 0.6 10*3/uL (ref 0.1–0.9)
Monocytes: 7 %
Neutrophils Absolute: 4.2 10*3/uL (ref 1.4–7.0)
Neutrophils: 51 %
Platelets: 171 10*3/uL (ref 150–450)
RBC: 5.22 x10E6/uL (ref 4.14–5.80)
RDW: 12.6 % (ref 11.6–15.4)
WBC: 8.4 10*3/uL (ref 3.4–10.8)

## 2018-10-11 LAB — LIPID PANEL
Chol/HDL Ratio: 5.4 ratio — ABNORMAL HIGH (ref 0.0–5.0)
Cholesterol, Total: 185 mg/dL (ref 100–199)
HDL: 34 mg/dL — ABNORMAL LOW (ref >39)
LDL Chol Calc (NIH): 97 mg/dL (ref 0–99)
Triglycerides: 321 mg/dL — ABNORMAL HIGH (ref 0–149)
VLDL Cholesterol Cal: 54 mg/dL — ABNORMAL HIGH (ref 5–40)

## 2018-10-11 LAB — CMP14+EGFR
ALT: 52 IU/L — ABNORMAL HIGH (ref 0–44)
AST: 25 IU/L (ref 0–40)
Albumin/Globulin Ratio: 1.9 (ref 1.2–2.2)
Albumin: 4.3 g/dL (ref 4.0–5.0)
Alkaline Phosphatase: 53 IU/L (ref 39–117)
BUN/Creatinine Ratio: 11 (ref 9–20)
BUN: 10 mg/dL (ref 6–24)
Bilirubin Total: 0.4 mg/dL (ref 0.0–1.2)
CO2: 20 mmol/L (ref 20–29)
Calcium: 9.8 mg/dL (ref 8.7–10.2)
Chloride: 102 mmol/L (ref 96–106)
Creatinine, Ser: 0.91 mg/dL (ref 0.76–1.27)
GFR calc Af Amer: 115 mL/min/{1.73_m2} (ref >59)
GFR calc non Af Amer: 99 mL/min/{1.73_m2} (ref >59)
Globulin, Total: 2.3 g/dL (ref 1.5–4.5)
Glucose: 225 mg/dL — ABNORMAL HIGH (ref 65–99)
Potassium: 4.4 mmol/L (ref 3.5–5.2)
Sodium: 140 mmol/L (ref 134–144)
Total Protein: 6.6 g/dL (ref 6.0–8.5)

## 2018-12-07 ENCOUNTER — Other Ambulatory Visit: Payer: Self-pay

## 2018-12-07 DIAGNOSIS — I771 Stricture of artery: Secondary | ICD-10-CM

## 2018-12-07 DIAGNOSIS — L98499 Non-pressure chronic ulcer of skin of other sites with unspecified severity: Secondary | ICD-10-CM

## 2018-12-09 ENCOUNTER — Other Ambulatory Visit: Payer: Self-pay

## 2018-12-09 ENCOUNTER — Ambulatory Visit (HOSPITAL_COMMUNITY)
Admission: RE | Admit: 2018-12-09 | Discharge: 2018-12-09 | Disposition: A | Discharge status: Home / Self Care | Payer: BC Managed Care – PPO | Source: Ambulatory Visit | Attending: Family | Admitting: Family

## 2018-12-09 ENCOUNTER — Ambulatory Visit (INDEPENDENT_AMBULATORY_CARE_PROVIDER_SITE_OTHER): Payer: PRIVATE HEALTH INSURANCE | Admitting: Vascular Surgery

## 2018-12-09 ENCOUNTER — Encounter: Payer: PRIVATE HEALTH INSURANCE | Admitting: Vascular Surgery

## 2018-12-09 ENCOUNTER — Other Ambulatory Visit (HOSPITAL_COMMUNITY): Payer: PRIVATE HEALTH INSURANCE

## 2018-12-09 ENCOUNTER — Encounter: Payer: Self-pay | Admitting: Vascular Surgery

## 2018-12-09 ENCOUNTER — Ambulatory Visit (HOSPITAL_COMMUNITY): Payer: PRIVATE HEALTH INSURANCE

## 2018-12-09 VITALS — BP 134/91 | HR 97 | Temp 97.7°F | Resp 20 | Ht 70.0 in | Wt 200.7 lb

## 2018-12-09 DIAGNOSIS — I83019 Varicose veins of right lower extremity with ulcer of unspecified site: Secondary | ICD-10-CM

## 2018-12-09 DIAGNOSIS — I83029 Varicose veins of left lower extremity with ulcer of unspecified site: Secondary | ICD-10-CM

## 2018-12-09 DIAGNOSIS — L97929 Non-pressure chronic ulcer of unspecified part of left lower leg with unspecified severity: Secondary | ICD-10-CM | POA: Diagnosis not present

## 2018-12-09 DIAGNOSIS — I771 Stricture of artery: Secondary | ICD-10-CM

## 2018-12-09 DIAGNOSIS — L98499 Non-pressure chronic ulcer of skin of other sites with unspecified severity: Secondary | ICD-10-CM | POA: Diagnosis not present

## 2018-12-09 DIAGNOSIS — L97919 Non-pressure chronic ulcer of unspecified part of right lower leg with unspecified severity: Secondary | ICD-10-CM

## 2018-12-09 NOTE — Progress Notes (Signed)
Patient ID: Craig Allison, male   DOB: Jul 31, 1970, 48 y.o.   MRN: [id number]  Reason for Consult: New Patient (Initial Visit)   Referred by Dettinger, Elige Radon, MD  Subjective:     HPI:  Craig Allison is a 48 y.o. male presents for evaluation of bilateral lower extremity wounds.  Has had multiple wounds of his right lateral leg currently has a ulcer on the left anterior shin.  Is never had any arterial procedures or venous procedures in his leg.  Does have diabetes and is a smoker also has hyperlipidemia as risk factors.  Does not take any blood thinners.  Mother had vein ablation procedures for ulceration of her right leg.  Not having any fevers or chills.  Only has minimal swelling of bilateral lower extremities.  Past Medical History:  Diagnosis Date  . Diabetes mellitus without complication (HCC)   . GERD (gastroesophageal reflux disease)   . Hyperlipidemia    Family History  Problem Relation Age of Onset  . Hypertension Mother   . Cancer Maternal Uncle        Pancreatic  . Diabetes Maternal Grandmother   . Stroke Maternal Grandfather    Past Surgical History:  Procedure Laterality Date  . APPENDECTOMY    . VASECTOMY      Short Social History:  Social History   Tobacco Use  . Smoking status: Current Every Day Smoker    Packs/day: 1.00    Types: Cigarettes  . Smokeless tobacco: Never Used  Substance Use Topics  . Alcohol use: No    No Known Allergies  Current Outpatient Medications  Medication Sig Dispense Refill  . omeprazole (PRILOSEC OTC) 20 MG tablet Take 20 mg by mouth daily as needed. For acid reflux     . pravastatin (PRAVACHOL) 20 MG tablet Take 1 tablet (20 mg total) by mouth daily with supper. 90 tablet 3  . sildenafil (REVATIO) 20 MG tablet Take 1-3 tablets (20-60 mg total) by mouth as needed. 30 tablet 1  . SitaGLIPtin-MetFORMIN HCl (JANUMET XR) 50-1000 MG TB24 Take 1 tablet by mouth 2 (two) times daily. 180 tablet 3   No current  facility-administered medications for this visit.     Review of Systems  Constitutional:  Constitutional negative. HENT: HENT negative.  Eyes: Eyes negative.  Cardiovascular: Positive for leg swelling.  GI: Gastrointestinal negative.  Musculoskeletal: Musculoskeletal negative.  Skin: Positive for wound.  Neurological: Positive for numbness.  Hematologic: Hematologic/lymphatic negative.  Psychiatric: Psychiatric negative.        Objective:  Objective   Vitals:   12/09/18 1138  BP: (!) 134/91  Pulse: 97  Resp: 20  Temp: 97.7 F (36.5 C)  SpO2: 99%  Weight: 200 lb 11.2 oz (91 kg)  Height: 5\' 10"  (1.778 m)   Body mass index is 28.8 kg/m.  Physical Exam HENT:     Head: Normocephalic.     Nose: Nose normal.     Mouth/Throat:     Mouth: Mucous membranes are moist.  Eyes:     Pupils: Pupils are equal, round, and reactive to light.  Cardiovascular:     Rate and Rhythm: Normal rate.     Pulses:          Radial pulses are 2+ on the right side and 2+ on the left side.       Popliteal pulses are 2+ on the right side and 2+ on the left side.       Dorsalis pedis  pulses are 2+ on the right side and 2+ on the left side.       Posterior tibial pulses are 2+ on the right side and 2+ on the left side.  Pulmonary:     Effort: Pulmonary effort is normal.  Abdominal:     General: Abdomen is flat.     Palpations: Abdomen is soft.  Musculoskeletal:     Right lower leg: Edema present.     Left lower leg: Edema present.  Skin:    General: Skin is warm and dry.     Capillary Refill: Capillary refill takes less than 2 seconds.     Comments: There are healed ulcers right lateral leg.  He has hemosiderin deposition.  He also has evidence of varicosities on the right.  Left lower extremity has an active anterior shin ulceration.  He has multiple spider veins bilaterally no varicosities on the left.  Neurological:     General: No focal deficit present.     Mental Status: He is alert.   Psychiatric:        Mood and Affect: Mood normal.        Behavior: Behavior normal.        Thought Content: Thought content normal.        Judgment: Judgment normal.     Data: I independently interpreted his bilateral lower extremity ABIs.  They are greater than 1 bilaterally.  Toe pressure on the right 199 on the left 233     Assessment/Plan:     48 year old male with bilateral lower extremity ulceration healed on the right with some active ulceration on the left shin.  He was sent for arterial evaluation does have palpable pulses bilaterally and normal ABIs.  Appears that he does have venous insufficiency.  If he does have venous insufficiency this is consistent with C6 ulceration on the left C5 on the right with concomitant trace edema and varicosities on the right.  We will get him set up for bilateral lower extremity venous reflux testing.  I recommended elevating his legs and wearing compression stockings.  If he does have reflux he will need to wear thigh-high compression stockings for 3 months prior to undergoing intervention to prevent further ulceration.  He does demonstrate good understanding we will follow-up with venous reflux testing in 2 months.     Waynetta Sandy MD [facility]

## 2019-01-11 ENCOUNTER — Ambulatory Visit: Payer: Self-pay | Admitting: Family Medicine

## 2019-01-26 ENCOUNTER — Telehealth: Payer: Self-pay | Admitting: Family Medicine

## 2019-01-26 ENCOUNTER — Other Ambulatory Visit: Payer: Self-pay

## 2019-01-27 ENCOUNTER — Ambulatory Visit: Payer: BC Managed Care – PPO | Admitting: Family Medicine

## 2019-01-27 ENCOUNTER — Encounter: Payer: Self-pay | Admitting: Family Medicine

## 2019-01-27 VITALS — BP 135/86

## 2019-01-27 DIAGNOSIS — E785 Hyperlipidemia, unspecified: Secondary | ICD-10-CM | POA: Diagnosis not present

## 2019-01-27 DIAGNOSIS — K219 Gastro-esophageal reflux disease without esophagitis: Secondary | ICD-10-CM | POA: Diagnosis not present

## 2019-01-27 DIAGNOSIS — E1169 Type 2 diabetes mellitus with other specified complication: Secondary | ICD-10-CM

## 2019-01-27 DIAGNOSIS — E119 Type 2 diabetes mellitus without complications: Secondary | ICD-10-CM | POA: Diagnosis not present

## 2019-01-27 LAB — BAYER DCA HB A1C WAIVED: HB A1C (BAYER DCA - WAIVED): 7.7 % — ABNORMAL HIGH (ref <7.0)

## 2019-01-27 MED ORDER — GLIMEPIRIDE 2 MG PO TABS: 2.0000 mg | ORAL_TABLET | Freq: Every day | ORAL | 3 refills | Status: DC

## 2019-01-27 NOTE — Progress Notes (Signed)
BP 135/86    Subjective:   Patient ID: Craig Allison, male    DOB: [birth year], 48 y.o.   MRN: [id number]  HPI: Craig Allison is a 48 y.o. male presenting on 01/27/2019 for Medical Management of Chronic Issues (3 mths ) and Diabetes (Patient wants to go back to meformin)   HPI Type 2 diabetes mellitus Patient comes in today for recheck of his diabetes. Patient has been currently taking Janumet. Patient is not currently on an ACE inhibitor/ARB. Patient has not seen an ophthalmologist this year. Patient denies any issues with their feet.   GERD Patient is currently on omeprazole.  She denies any major symptoms or abdominal pain or belching or burping. She denies any blood in her stool or lightheadedness or dizziness.   Hyperlipidemia Patient is coming in for recheck of his hyperlipidemia. The patient is currently taking pravastatin. They deny any issues with myalgias or history of liver damage from it. They deny any focal numbness or weakness or chest pain.   Relevant past medical, surgical, family and social history reviewed and updated as indicated. Interim medical history since our last visit reviewed. Allergies and medications reviewed and updated.  Review of Systems  Constitutional: Negative for chills and fever.  Eyes: Negative for visual disturbance.  Respiratory: Negative for shortness of breath and wheezing.   Cardiovascular: Negative for chest pain and leg swelling.  Musculoskeletal: Negative for back pain and gait problem.  Skin: Negative for rash.  Neurological: Negative for dizziness, weakness, light-headedness and numbness.  All other systems reviewed and are negative.   Per HPI unless specifically indicated above   Allergies as of 01/27/2019   No Known Allergies     Medication List       Accurate as of January 27, 2019  4:54 PM. If you have any questions, ask your nurse or doctor.        glimepiride 2 MG tablet Commonly known as: AMARYL Take 1  tablet (2 mg total) by mouth daily with breakfast. Started by: Elige Radon Dior Dominik, MD   Janumet XR 50-1000 MG Tb24 Generic drug: SitaGLIPtin-MetFORMIN HCl Take 1 tablet by mouth 2 (two) times daily.   omeprazole 20 MG tablet Commonly known as: PRILOSEC OTC Take 20 mg by mouth daily as needed. For acid reflux   pravastatin 20 MG tablet Commonly known as: PRAVACHOL Take 1 tablet (20 mg total) by mouth daily with supper.   sildenafil 20 MG tablet Commonly known as: REVATIO Take 1-3 tablets (20-60 mg total) by mouth as needed.        Objective:   BP 135/86   Wt Readings from Last 3 Encounters:  12/09/18 200 lb 11.2 oz (91 kg)  02/23/18 198 lb (89.8 kg)  06/11/17 184 lb 3.2 oz (83.6 kg)    Physical Exam Vitals and nursing note reviewed.  Constitutional:      General: He is not in acute distress.    Appearance: He is well-developed. He is not diaphoretic.  Eyes:     General: No scleral icterus.    Conjunctiva/sclera: Conjunctivae normal.  Neck:     Thyroid: No thyromegaly.  Cardiovascular:     Rate and Rhythm: Normal rate and regular rhythm.     Heart sounds: Normal heart sounds. No murmur.  Pulmonary:     Effort: Pulmonary effort is normal. No respiratory distress.     Breath sounds: Normal breath sounds. No wheezing.  Musculoskeletal:        General: Normal  range of motion.     Cervical back: Neck supple.  Lymphadenopathy:     Cervical: No cervical adenopathy.  Skin:    General: Skin is warm and dry.     Findings: No rash.  Neurological:     Mental Status: He is alert and oriented to person, place, and time.     Coordination: Coordination normal.  Psychiatric:        Behavior: Behavior normal.      Assessment & Plan:   Problem List Items Addressed This Visit      Digestive   GERD (gastroesophageal reflux disease)     Endocrine   Diabetes mellitus without complication ([place]) - Primary   Relevant Medications   glimepiride (AMARYL) 2 MG tablet    Other Relevant Orders   hgba1c   Microalbumin / creatinine urine ratio   Hyperlipidemia associated with type 2 diabetes mellitus (HCC)   Relevant Medications   glimepiride (AMARYL) 2 MG tablet      Will add glimepiride Continue pravastatin omeprazole and Janumet  Follow up plan: Return in about 3 months (around 04/27/2019), or if symptoms worsen or fail to improve.  Counseling provided for all of the vaccine components Orders Placed This Encounter  Procedures  . hgba1c  . Microalbumin / creatinine urine ratio    [name], MD [facility] 01/27/2019, 4:54 PM

## 2019-01-28 LAB — MICROALBUMIN / CREATININE URINE RATIO
Creatinine, Urine: 97.1 mg/dL
Microalb/Creat Ratio: 7 mg/g creat (ref 0–29)
Microalbumin, Urine: 6.6 ug/mL

## 2019-01-30 ENCOUNTER — Telehealth: Payer: Self-pay | Admitting: *Deleted

## 2019-01-30 NOTE — Telephone Encounter (Addendum)
Prior Auth for Glimepiride 2mg -APPROVED till 01/30/20  Pharmacy notified  Key: [id number] -   PA Case ID: [id number]

## 2019-02-16 ENCOUNTER — Other Ambulatory Visit: Payer: Self-pay

## 2019-02-16 ENCOUNTER — Telehealth (HOSPITAL_COMMUNITY): Payer: Self-pay

## 2019-02-16 DIAGNOSIS — I83019 Varicose veins of right lower extremity with ulcer of unspecified site: Secondary | ICD-10-CM

## 2019-02-16 DIAGNOSIS — I83029 Varicose veins of left lower extremity with ulcer of unspecified site: Secondary | ICD-10-CM

## 2019-02-16 NOTE — Telephone Encounter (Signed)

## 2019-02-17 ENCOUNTER — Ambulatory Visit (HOSPITAL_COMMUNITY)
Admission: RE | Admit: 2019-02-17 | Discharge: 2019-02-17 | Disposition: A | Discharge status: Home / Self Care | Payer: BC Managed Care – PPO | Source: Ambulatory Visit | Attending: Family | Admitting: Family

## 2019-02-17 ENCOUNTER — Ambulatory Visit (INDEPENDENT_AMBULATORY_CARE_PROVIDER_SITE_OTHER): Payer: BC Managed Care – PPO | Admitting: Family

## 2019-02-17 ENCOUNTER — Telehealth: Payer: Self-pay | Admitting: *Deleted

## 2019-02-17 ENCOUNTER — Encounter: Payer: Self-pay | Admitting: Family

## 2019-02-17 ENCOUNTER — Other Ambulatory Visit: Payer: Self-pay

## 2019-02-17 VITALS — BP 137/86 | HR 95 | Temp 97.3°F | Resp 18 | Ht 70.0 in | Wt 206.0 lb

## 2019-02-17 DIAGNOSIS — L97919 Non-pressure chronic ulcer of unspecified part of right lower leg with unspecified severity: Secondary | ICD-10-CM | POA: Insufficient documentation

## 2019-02-17 DIAGNOSIS — I83019 Varicose veins of right lower extremity with ulcer of unspecified site: Secondary | ICD-10-CM | POA: Diagnosis not present

## 2019-02-17 DIAGNOSIS — I872 Venous insufficiency (chronic) (peripheral): Secondary | ICD-10-CM | POA: Diagnosis not present

## 2019-02-17 DIAGNOSIS — L97929 Non-pressure chronic ulcer of unspecified part of left lower leg with unspecified severity: Secondary | ICD-10-CM

## 2019-02-17 DIAGNOSIS — F17210 Nicotine dependence, cigarettes, uncomplicated: Secondary | ICD-10-CM | POA: Diagnosis not present

## 2019-02-17 DIAGNOSIS — F17200 Nicotine dependence, unspecified, uncomplicated: Secondary | ICD-10-CM

## 2019-02-17 DIAGNOSIS — I83029 Varicose veins of left lower extremity with ulcer of unspecified site: Secondary | ICD-10-CM | POA: Insufficient documentation

## 2019-02-17 NOTE — Progress Notes (Signed)
Virtual Visit via Telephone Note  I connected with Craig Allison on 02/17/2019 using the [employer] by telephone and verified that I was speaking with the correct person using two identifiers. Patient was located at his home and accompanied by his mother. I am located at the [facility] office/clinic.   The limitations of evaluation and management by telemedicine and the availability of in person appointments have been previously discussed with the patient and are documented in the patients chart. The patient expressed understanding and consented to proceed.  PCP: Dettinger, Fransisca Kaufmann, MD  Chief Complaint: follow up venous reflux duplex of legs  History of Present Illness: Craig Allison is a 49 y.o. male whom Dr. Donzetta Matters saw on initial evaluation on 12-09-18 with bilateral lower extremity ulceration healed on the right with some active ulceration on the left shin.  He was sent for arterial evaluation, had palpable pulses bilaterally and normal ABIs.  Appeared that he does have venous insufficiency.  If he does have venous insufficiency this is consistent with C6 ulceration on the left C5 on the right with concomitant trace edema and varicosities on the right.  Dr. Donzetta Matters recommended elevating his legs and wearing compression stockings.  If he does have reflux he will need to wear thigh-high compression stockings for 3 months prior to undergoing intervention to prevent further ulceration.  He  demonstrateed good understanding we will follow-up with venous reflux testing in 2 months.   Pt returned today for venous reflux ultrasound of lower extremities.   He states he has a small healing wound on his right lower leg, he denies any pain in his legs, states he walks 4-6 miles daily at is job. He states that he may get small injuries on his legs at his job.  He denies swelling in his legs unless he has a wound.    DM: yes, A1C ws 7.7 on 01-27-19 Tobacco use: current smoker, 1 ppd   Past Medical History:    Diagnosis Date  . Diabetes mellitus without complication ([place])   . GERD (gastroesophageal reflux disease)   . Hyperlipidemia     Past Surgical History:  Procedure Laterality Date  . APPENDECTOMY    . VASECTOMY     Social History   Socioeconomic History  . Marital status: Married    Spouse name: Not on file  . Number of children: Not on file  . Years of education: Not on file  . Highest education level: Not on file  Occupational History  . Not on file  Tobacco Use  . Smoking status: Current Every Day Smoker    Packs/day: 1.00    Types: Cigarettes  . Smokeless tobacco: Never Used  Substance and Sexual Activity  . Alcohol use: No  . Drug use: No  . Sexual activity: Yes  Other Topics Concern  . Not on file  Social History Narrative  . Not on file   Social Determinants of Health   Financial Resource Strain:   . Difficulty of Paying Living Expenses: Not on file  Food Insecurity:   . Worried About Charity fundraiser in the Last Year: Not on file  . Ran Out of Food in the Last Year: Not on file  Transportation Needs:   . Lack of Transportation (Medical): Not on file  . Lack of Transportation (Non-Medical): Not on file  Physical Activity:   . Days of Exercise per Week: Not on file  . Minutes of Exercise per Session: Not on  file  Stress:   . Feeling of Stress : Not on file  Social Connections:   . Frequency of Communication with Friends and Family: Not on file  . Frequency of Social Gatherings with Friends and Family: Not on file  . Attends Religious Services: Not on file  . Active Member of Clubs or Organizations: Not on file  . Attends Banker Meetings: Not on file  . Marital Status: Not on file  Intimate Partner Violence:   . Fear of Current or Ex-Partner: Not on file  . Emotionally Abused: Not on file  . Physically Abused: Not on file  . Sexually Abused: Not on file     Current Meds  Medication Sig  . glimepiride (AMARYL) 2 MG tablet Take 1  tablet (2 mg total) by mouth daily with breakfast.  . omeprazole (PRILOSEC OTC) 20 MG tablet Take 20 mg by mouth daily as needed. For acid reflux   . pravastatin (PRAVACHOL) 20 MG tablet Take 1 tablet (20 mg total) by mouth daily with supper.  . sildenafil (REVATIO) 20 MG tablet Take 1-3 tablets (20-60 mg total) by mouth as needed.  . SitaGLIPtin-MetFORMIN HCl (JANUMET XR) 50-1000 MG TB24 Take 1 tablet by mouth 2 (two) times daily.    12 system ROS was negative unless otherwise noted in HPI   Observations/Objective:  DATA  Bilateral LE Venous Duplex (02-17-19):  Right: There is no evidence of deep vein thrombosis in the lower extremity from the common femoral vein to the popliteal vein. Reflux in the greater saphenous vein in the proximal calf. No reflux in the small saphenous vein. . Left: There is no evidence of deep vein thrombosis in the lower extremity from the common femoral vein to the popliteal vein. No reflux in the deep or superficial veins.   Enlarged lymph nodes in bilateral groin.   Assessment and Plan: Minor venous reflux in the right leg, no venous reflux in the left leg. No DVT.  I discussed with Dr. Randie Heinz the above results.   Pt states that his wounds in his legs heal fairly quickly, states he probably bumps his legs at work walking 4-6 miles daily.  I advised OTC knee high compression hose as needed to prevent swelling in his legs if he has some from being on his feet all day.    Smoker: Over 3 minutes was spent counseling patient re smoking cessation, and patient was given several free resources re smoking cessation.    Follow Up Instructions:   Follow up as needed.   I discussed the assessment and treatment plan with the patient. The patient was provided an opportunity to ask questions and all were answered. The patient agreed with the plan and demonstrated an understanding of the instructions.   The patient was advised to call back or seek an in-person  evaluation if the symptoms worsen or if the condition fails to improve as anticipated.  I spent 10 minutes with the patient via telephone encounter.   Donnalee Curry Aysen Shieh [facility] [place] Office: [phone]  02/17/2019, 1:34 PM

## 2019-02-17 NOTE — Telephone Encounter (Signed)
Virtual Visit Pre-Appointment Phone Call  Today, I spoke with Craig Allison and performed the following actions:  1. I explained that we are currently trying to limit exposure to the COVID-19 virus by seeing patients at home rather than in the office.  I explained that the visits are best done by video, but can be done by telephone.  I asked the patient if a virtual visit that the patient would like to try instead of coming into the office. Craig Allison agreed to proceed with the virtual visit scheduled with Rosalita Chessman Nickel on 02/17/19.     2. I confirmed the BEST phone number to call the day of the visit and- I included this in appointment notes.  3. I asked if the patient had access to (through a family member/friend) a smartphone with video capability to be used for his visit?"  The patient said yes -         4. I confirmed consent by  a. sending through MyChart or by email the FULL LENGTH CONSENT FOR TELE-HEALTH VISIT as written at the end of this message or  b. verbally as listed below. i. This visit is being performed in the setting of COVID-19. ii. All virtual visits are billed to your insurance company just like a normal visit would be.   iii. We'd like you to understand that the technology does not allow for your provider to perform an examination, and thus may limit your provider's ability to fully assess your condition.  iv. If your provider identifies any concerns that need to be evaluated in person, we will make arrangements to do so.   v. Finally, though the technology is pretty good, we cannot assure that it will always work on either your or our end, and in the setting of a video visit, we may have to convert it to a phone-only visit.  In either situation, we cannot ensure that we have a secure connection.   vi. Are you willing to proceed?"  STAFF: Did the patient verbally acknowledge consent to telehealth visit? Document YES/NO here: YES  2. I advised the patient to  be prepared - I asked that the patient, on the day of his visit, record any information possible with the equipment at his home, such as blood pressure, pulse, oxygen saturation, and your weight and write them all down. I asked the patient to have a pen and paper handy nearby the day of the visit as well.  3. If the patient was scheduled for a video visit, I informed the patient that the visit with the doctor would start with a text to the smartphone # given to [place] by the patient.         If the patient was scheduled for a telephone call, I informed the patient that the visit with the doctor would start with a call to the telephone # given to [place] by the patient.  4. I Informed patient they will receive a phone call 15 minutes prior to their appointment time from a CMA or nurse to review medications, allergies, etc. to prepare for the visit.    TELEPHONE CALL NOTE  Craig Allison has been deemed a candidate for a follow-up tele-health visit to limit community exposure during the Covid-19 pandemic. I spoke with the patient via phone to ensure availability of phone/video source, confirm preferred email & phone number, and discuss instructions and expectations.  I reminded Craig Allison to be prepared  with any vital sign and/or heart rhythm information that could potentially be obtained via home monitoring, at the time of his visit. I reminded Craig Allison to expect a phone call prior to his visit.  Cleaster Corin, NT 02/17/2019 11:44 AM     FULL LENGTH CONSENT FOR TELE-HEALTH VISIT   I hereby voluntarily request, consent and authorize [facility] and its employed or contracted physicians, physician assistants, nurse practitioners or other licensed health care professionals (the Practitioner), to provide me with telemedicine health care services (the "Services") as deemed necessary by the treating Practitioner. I acknowledge and consent to receive the Services by the Practitioner via  telemedicine. I understand that the telemedicine visit will involve communicating with the Practitioner through live audiovisual communication technology and the disclosure of certain medical information by electronic transmission. I acknowledge that I have been given the opportunity to request an in-person assessment or other available alternative prior to the telemedicine visit and am voluntarily participating in the telemedicine visit.  I understand that I have the right to withhold or withdraw my consent to the use of telemedicine in the course of my care at any time, without affecting my right to future care or treatment, and that the Practitioner or I may terminate the telemedicine visit at any time. I understand that I have the right to inspect all information obtained and/or recorded in the course of the telemedicine visit and may receive copies of available information for a reasonable fee.  I understand that some of the potential risks of receiving the Services via telemedicine include:  Marland Kitchen Delay or interruption in medical evaluation due to technological equipment failure or disruption; . Information transmitted may not be sufficient (e.g. poor resolution of images) to allow for appropriate medical decision making by the Practitioner; and/or  . In rare instances, security protocols could fail, causing a breach of personal health information.  Furthermore, I acknowledge that it is my responsibility to provide information about my medical history, conditions and care that is complete and accurate to the best of my ability. I acknowledge that Practitioner's advice, recommendations, and/or decision may be based on factors not within their control, such as incomplete or inaccurate data provided by me or distortions of diagnostic images or specimens that may result from electronic transmissions. I understand that the practice of medicine is not an exact science and that Practitioner makes no warranties or  guarantees regarding treatment outcomes. I acknowledge that I will receive a copy of this consent concurrently upon execution via email to the email address I last provided but may also request a printed copy by calling the office of [place].    I understand that my insurance will be billed for this visit.   I have read or had this consent read to me. . I understand the contents of this consent, which adequately explains the benefits and risks of the Services being provided via telemedicine.  . I have been provided ample opportunity to ask questions regarding this consent and the Services and have had my questions answered to my satisfaction. . I give my informed consent for the services to be provided through the use of telemedicine in my medical care  By participating in this telemedicine visit I agree to the above.

## 2019-02-17 NOTE — Patient Instructions (Signed)
Tobacco Use Disorder Tobacco use disorder (TUD) occurs when a person craves, seeks, and uses tobacco, regardless of the consequences. This disorder can cause problems with mental and physical health. It can affect your ability to have healthy relationships, and it can keep you from meeting your responsibilities at work, home, or school. Tobacco may be:  Smoked as a cigarette or cigar.  Inhaled using e-cigarettes.  Smoked in a pipe or hookah.  Chewed as smokeless tobacco.  Inhaled into the nostrils as snuff. Tobacco products contain a dangerous chemical called nicotine, which is very addictive. Nicotine triggers hormones that make the body feel stimulated and works on areas of the brain that make you feel good. These effects can make it hard for people to quit nicotine. Tobacco contains many other unsafe chemicals that can damage almost every organ in the body. Smoking tobacco also puts others in danger due to fire risk and possible health problems caused by breathing in secondhand smoke. What are the signs or symptoms? Symptoms of TUD may include:  Being unable to slow down or stop your tobacco use.  Spending an abnormal amount of time getting or using tobacco.  Craving tobacco. Cravings may last for up to 6 months after quitting.  Tobacco use that: ? Interferes with your work, school, or home life. ? Interferes with your personal and social relationships. ? Makes you give up activities that you once enjoyed or found important.  Using tobacco even though you know that it is: ? Dangerous or bad for your health or someone else's health. ? Causing problems in your life.  Needing more and more of the substance to get the same effect (developing tolerance).  Experiencing unpleasant symptoms if you do not use the substance (withdrawal). Withdrawal symptoms may include: ? Depressed, anxious, or irritable mood. ? Difficulty concentrating. ? Increased appetite. ? Restlessness or trouble  sleeping.  Using the substance to avoid withdrawal. How is this diagnosed? This condition may be diagnosed based on:  Your current and past tobacco use. Your health care provider may ask questions about how your tobacco use affects your life.  A physical exam. You may be diagnosed with TUD if you have at least two symptoms within a 12-month period. How is this treated? This condition is treated by stopping tobacco use. Many people are unable to quit on their own and need help. Treatment may include:  Nicotine replacement therapy (NRT). NRT provides nicotine without the other harmful chemicals in tobacco. NRT gradually lowers the dosage of nicotine in the body and reduces withdrawal symptoms. NRT is available as: ? Over-the-counter gums, lozenges, and skin patches. ? Prescription mouth inhalers and nasal sprays.  Medicine that acts on the brain to reduce cravings and withdrawal symptoms.  A type of talk therapy that examines your triggers for tobacco use, how to avoid them, and how to cope with cravings (behavioral therapy).  Hypnosis. This may help with withdrawal symptoms.  Joining a support group for others coping with TUD. The best treatment for TUD is usually a combination of medicine, talk therapy, and support groups. Recovery can be a long process. Many people start using tobacco again after stopping (relapse). If you relapse, it does not mean that treatment will not work. Follow these instructions at home:  Lifestyle  Do not use any products that contain nicotine or tobacco, such as cigarettes and e-cigarettes.  Avoid things that trigger tobacco use as much as you can. Triggers include people and situations that usually cause you   to use tobacco.  Avoid drinks that contain caffeine, including coffee. These may worsen some withdrawal symptoms.  Find ways to manage stress. Wanting to smoke may cause stress, and stress can make you want to smoke. Relaxation techniques such as  deep breathing, meditation, and yoga may help.  Attend support groups as needed. These groups are an important part of long-term recovery for many people. General instructions  Take over-the-counter and prescription medicines only as told by your health care provider.  Check with your health care provider before taking any new prescription or over-the-counter medicines.  Decide on a friend, family member, or smoking quit-line (such as 1-800-QUIT-NOW in the [place] that you can call or text when you feel the urge to smoke or when you need help coping with cravings.  Keep all follow-up visits as told by your health care provider and therapist. This is important. Contact a health care provider if:  You are not able to take your medicines as prescribed.  Your symptoms get worse, even with treatment. Summary  Tobacco use disorder (TUD) occurs when a person craves, seeks, and uses tobacco regardless of the consequences.  This condition may be diagnosed based on your current and past tobacco use and a physical exam.  Many people are unable to quit on their own and need help. Recovery can be a long process.  The most effective treatment for TUD is usually a combination of medicine, talk therapy, and support groups. This information is not intended to replace advice given to you by your health care provider. Make sure you discuss any questions you have with your health care provider. Document Revised: 01/13/2017 Document Reviewed: 01/13/2017 Elsevier Patient Education  2020 [employer].     Steps to Quit Smoking Smoking tobacco is the leading cause of preventable death. It can affect almost every organ in the body. Smoking puts you and people around you at risk for many serious, long-lasting (chronic) diseases. Quitting smoking can be hard, but it is one of the best things that you can do for your health. It is never too late to quit. How do I get ready to quit? When you decide to quit  smoking, make a plan to help you succeed. Before you quit:  Pick a date to quit. Set a date within the next 2 weeks to give you time to prepare.  Write down the reasons why you are quitting. Keep this list in places where you will see it often.  Tell your family, friends, and co-workers that you are quitting. Their support is important.  Talk with your doctor about the choices that may help you quit.  Find out if your health insurance will pay for these treatments.  Know the people, places, things, and activities that make you want to smoke (triggers). Avoid them. What first steps can I take to quit smoking?  Throw away all cigarettes at home, at work, and in your car.  Throw away the things that you use when you smoke, such as ashtrays and lighters.  Clean your car. Make sure to empty the ashtray.  Clean your home, including curtains and carpets. What can I do to help me quit smoking? Talk with your doctor about taking medicines and seeing a counselor at the same time. You are more likely to succeed when you do both.  If you are pregnant or breastfeeding, talk with your doctor about counseling or other ways to quit smoking. Do not take medicine to help you quit smoking  unless your doctor tells you to do so. To quit smoking: Quit right away  Quit smoking totally, instead of slowly cutting back on how much you smoke over a period of time.  Go to counseling. You are more likely to quit if you go to counseling sessions regularly. Take medicine You may take medicines to help you quit. Some medicines need a prescription, and some you can buy over-the-counter. Some medicines may contain a drug called nicotine to replace the nicotine in cigarettes. Medicines may:  Help you to stop having the desire to smoke (cravings).  Help to stop the problems that come when you stop smoking (withdrawal symptoms). Your doctor may ask you to use:  Nicotine patches, gum, or lozenges.  Nicotine  inhalers or sprays.  Non-nicotine medicine that is taken by mouth. Find resources Find resources and other ways to help you quit smoking and remain smoke-free after you quit. These resources are most helpful when you use them often. They include:  Online chats with a Social worker.  Phone quitlines.  Printed Furniture conservator/restorer.  Support groups or group counseling.  Text messaging programs.  Mobile phone apps. Use apps on your mobile phone or tablet that can help you stick to your quit plan. There are many free apps for mobile phones and tablets as well as websites. Examples include Quit Guide from the [employer] and smokefree.gov  What things can I do to make it easier to quit?   Talk to your family and friends. Ask them to support and encourage you.  Call a phone quitline (1-800-QUIT-NOW), reach out to support groups, or work with a Social worker.  Ask people who smoke to not smoke around you.  Avoid places that make you want to smoke, such as: ? Bars. ? Parties. ? Smoke-break areas at work.  Spend time with people who do not smoke.  Lower the stress in your life. Stress can make you want to smoke. Try these things to help your stress: ? Getting regular exercise. ? Doing deep-breathing exercises. ? Doing yoga. ? Meditating. ? Doing a body scan. To do this, close your eyes, focus on one area of your body at a time from head to toe. Notice which parts of your body are tense. Try to relax the muscles in those areas. How will I feel when I quit smoking? Day 1 to 3 weeks Within the first 24 hours, you may start to have some problems that come from quitting tobacco. These problems are very bad 2-3 days after you quit, but they do not often last for more than 2-3 weeks. You may get these symptoms:  Mood swings.  Feeling restless, nervous, angry, or annoyed.  Trouble concentrating.  Dizziness.  Strong desire for high-sugar foods and nicotine.  Weight gain.  Trouble pooping  (constipation).  Feeling like you may vomit (nausea).  Coughing or a sore throat.  Changes in how the medicines that you take for other issues work in your body.  Depression.  Trouble sleeping (insomnia). Week 3 and afterward After the first 2-3 weeks of quitting, you may start to notice more positive results, such as:  Better sense of smell and taste.  Less coughing and sore throat.  Slower heart rate.  Lower blood pressure.  Clearer skin.  Better breathing.  Fewer sick days. Quitting smoking can be hard. Do not give up if you fail the first time. Some people need to try a few times before they succeed. Do your best to stick to your  quit plan, and talk with your doctor if you have any questions or concerns. Summary  Smoking tobacco is the leading cause of preventable death. Quitting smoking can be hard, but it is one of the best things that you can do for your health.  When you decide to quit smoking, make a plan to help you succeed.  Quit smoking right away, not slowly over a period of time.  When you start quitting, seek help from your doctor, family, or friends. This information is not intended to replace advice given to you by your health care provider. Make sure you discuss any questions you have with your health care provider. Document Revised: 10/21/2018 Document Reviewed: 04/16/2018 Elsevier Patient Education  [place].

## 2019-02-28 ENCOUNTER — Encounter (INDEPENDENT_AMBULATORY_CARE_PROVIDER_SITE_OTHER): Payer: Self-pay

## 2019-02-28 ENCOUNTER — Encounter: Payer: Self-pay | Admitting: Family

## 2019-02-28 ENCOUNTER — Telehealth: Payer: Self-pay | Admitting: *Deleted

## 2019-02-28 ENCOUNTER — Other Ambulatory Visit: Payer: Self-pay

## 2019-02-28 ENCOUNTER — Ambulatory Visit (INDEPENDENT_AMBULATORY_CARE_PROVIDER_SITE_OTHER): Payer: BC Managed Care – PPO | Admitting: Family

## 2019-02-28 DIAGNOSIS — U071 COVID-19: Secondary | ICD-10-CM

## 2019-02-28 DIAGNOSIS — F17200 Nicotine dependence, unspecified, uncomplicated: Secondary | ICD-10-CM | POA: Diagnosis not present

## 2019-02-28 MED ORDER — ALBUTEROL SULFATE HFA 108 (90 BASE) MCG/ACT IN AERS: 2.0000 | INHALATION_SPRAY | Freq: Four times a day (QID) | RESPIRATORY_TRACT | 0 refills | Status: DC | PRN

## 2019-02-28 NOTE — Progress Notes (Signed)
Virtual Visit via telephone Note Due to COVID-19 pandemic this visit was conducted virtually. This visit type was conducted due to national recommendations for restrictions regarding the COVID-19 Pandemic (e.g. social distancing, sheltering in place) in an effort to limit this patient's exposure and mitigate transmission in our community. All issues noted in this document were discussed and addressed.  A physical exam was not performed with this format.  I connected with Tanna Savoy on 02/28/19 at 9:10 AM by telephone and verified that I am speaking with the correct person using two identifiers. BOLDEN HAGERMAN is currently located at home and no one is currently with him  during visit. The provider, Jannifer Rodney, FNP is located in their office at time of visit.  I discussed the limitations, risks, security and privacy concerns of performing an evaluation and management service by telephone and the availability of in person appointments. I also discussed with the patient that there may be a patient responsible charge related to this service. The patient expressed understanding and agreed to proceed.   History and Present Illness:  PT calls the office today for COVID symptoms. He states he started having URI symptoms on 02/21/19. He was tested for COVID on 02/23/19 and was positive. He reports he is doing well, but has a headache and rhinorrhea.. Cough This is a new problem. The current episode started 1 to 4 weeks ago. The problem has been unchanged. The problem occurs every few minutes. The cough is non-productive. Associated symptoms include chills, headaches, nasal congestion, postnasal drip and rhinorrhea. Pertinent negatives include no ear congestion, ear pain, fever, myalgias, shortness of breath or weight loss. Risk factors for lung disease include smoking/tobacco exposure. He has tried rest for the symptoms. The treatment provided mild relief.       Review of Systems    Constitutional: Positive for chills. Negative for fever and weight loss.  HENT: Positive for postnasal drip and rhinorrhea. Negative for ear pain.   Respiratory: Positive for cough. Negative for shortness of breath.   Musculoskeletal: Negative for myalgias.  Neurological: Positive for headaches.  All other systems reviewed and are negative.    Observations/Objective: No SOB or distress noted   Assessment and Plan: 1. COVID-19 virus detected Rest Force fluids Tylenol as needed Continue Mucinex  - MyChart COVID-19 home monitoring program; Future - albuterol (VENTOLIN HFA) 108 (90 Base) MCG/ACT inhaler; Inhale 2 puffs into the lungs every 6 (six) hours as needed for wheezing or shortness of breath.  Dispense: 8 g; Refill: 0  2. Current smoker Smoking cessation discussed      I discussed the assessment and treatment plan with the patient. The patient was provided an opportunity to ask questions and all were answered. The patient agreed with the plan and demonstrated an understanding of the instructions.   The patient was advised to call back or seek an in-person evaluation if the symptoms worsen or if the condition fails to improve as anticipated.  The above assessment and management plan was discussed with the patient. The patient verbalized understanding of and has agreed to the management plan. Patient is aware to call the clinic if symptoms persist or worsen. Patient is aware when to return to the clinic for a follow-up visit. Patient educated on when it is appropriate to go to the emergency department.   Time call ended:  9:28 AM   I provided 18 minutes of non-face-to-face time during this encounter.    Jannifer Rodney, FNP

## 2019-02-28 NOTE — Telephone Encounter (Signed)
Attempted to reach pt after receiving BPA via MyChart covid symptom monitoring for new onset cough. Left VM will be addressed via MyChart.

## 2019-03-01 ENCOUNTER — Encounter (INDEPENDENT_AMBULATORY_CARE_PROVIDER_SITE_OTHER): Payer: Self-pay

## 2019-03-01 ENCOUNTER — Ambulatory Visit: Payer: BC Managed Care – PPO | Admitting: Family Medicine

## 2019-03-02 ENCOUNTER — Encounter (INDEPENDENT_AMBULATORY_CARE_PROVIDER_SITE_OTHER): Payer: Self-pay

## 2019-03-03 ENCOUNTER — Encounter (INDEPENDENT_AMBULATORY_CARE_PROVIDER_SITE_OTHER): Payer: Self-pay

## 2019-03-04 ENCOUNTER — Encounter (INDEPENDENT_AMBULATORY_CARE_PROVIDER_SITE_OTHER): Payer: Self-pay

## 2019-03-05 ENCOUNTER — Encounter (INDEPENDENT_AMBULATORY_CARE_PROVIDER_SITE_OTHER): Payer: Self-pay

## 2019-03-06 ENCOUNTER — Encounter (INDEPENDENT_AMBULATORY_CARE_PROVIDER_SITE_OTHER): Payer: Self-pay

## 2019-03-07 ENCOUNTER — Telehealth: Payer: Self-pay | Admitting: Family Medicine

## 2019-03-07 NOTE — Telephone Encounter (Signed)
Please review and advise.

## 2019-03-07 NOTE — Telephone Encounter (Signed)
Pt called stating that he tested positive for COVID on 02/23/19 and is still having a lot of issues with congestion. Says he has taken a whole bottle of OTC Mucinex and says he does feel some better but says its like his congestion will go away and then come back.. Says his work wont let him come back to work until he sounds/is better. Wants to know if Dr Dettinger can prescribe him something or recommend something for him to take.

## 2019-03-08 ENCOUNTER — Ambulatory Visit: Payer: BC Managed Care – PPO | Admitting: Family Medicine

## 2019-03-08 NOTE — Telephone Encounter (Signed)
Put him on the virtual schedule, we can discuss it over the phone in a visit

## 2019-03-08 NOTE — Telephone Encounter (Signed)
Apt scheduled.  

## 2019-03-10 ENCOUNTER — Encounter (INDEPENDENT_AMBULATORY_CARE_PROVIDER_SITE_OTHER): Payer: Self-pay

## 2019-03-23 ENCOUNTER — Other Ambulatory Visit: Payer: Self-pay | Admitting: Family

## 2019-03-23 DIAGNOSIS — U071 COVID-19: Secondary | ICD-10-CM

## 2019-05-05 ENCOUNTER — Encounter: Payer: Self-pay | Admitting: Family Medicine

## 2019-05-05 ENCOUNTER — Ambulatory Visit: Payer: BC Managed Care – PPO | Admitting: Family Medicine

## 2019-05-05 ENCOUNTER — Other Ambulatory Visit: Payer: Self-pay

## 2019-05-05 VITALS — BP 133/89 | HR 110 | Temp 98.2°F | Ht 70.0 in | Wt 207.5 lb

## 2019-05-05 DIAGNOSIS — E119 Type 2 diabetes mellitus without complications: Secondary | ICD-10-CM

## 2019-05-05 DIAGNOSIS — K219 Gastro-esophageal reflux disease without esophagitis: Secondary | ICD-10-CM

## 2019-05-05 DIAGNOSIS — E1169 Type 2 diabetes mellitus with other specified complication: Secondary | ICD-10-CM | POA: Diagnosis not present

## 2019-05-05 DIAGNOSIS — E785 Hyperlipidemia, unspecified: Secondary | ICD-10-CM

## 2019-05-05 LAB — BAYER DCA HB A1C WAIVED: HB A1C (BAYER DCA - WAIVED): 7.2 % — ABNORMAL HIGH (ref <7.0)

## 2019-05-05 NOTE — Progress Notes (Signed)
BP 133/89   Pulse (!) 110   Temp 98.2 F (36.8 C)   Ht '5\' 10"'$  (1.778 m)   Wt 207 lb 8 oz (94.1 kg)   SpO2 96%   BMI 29.77 kg/m    Subjective:   Patient ID: Craig Allison, male    DOB: 02-Oct-1970, 49 y.o.   MRN: [id number]  HPI: Craig Allison is a 49 y.o. male presenting on 05/05/2019 for Medical Management of Chronic Issues and Diabetes   HPI Type 2 diabetes mellitus Patient comes in today for recheck of his diabetes. Patient has been currently taking Janumet and glimepiride. Patient is not currently on an ACE inhibitor/ARB. Patient has not seen an ophthalmologist this year. Patient denies any issues with their feet.   Hypertension Patient is currently on no medication currently, and their blood pressure today is 133/89. Patient denies any lightheadedness or dizziness. Patient denies headaches, blurred vision, chest pains, shortness of breath, or weakness. Denies any side effects from medication and is content with current medication.   Hyperlipidemia Patient is coming in for recheck of his hyperlipidemia. The patient is currently taking pravastatin. They deny any issues with myalgias or history of liver damage from it. They deny any focal numbness or weakness or chest pain.   Relevant past medical, surgical, family and social history reviewed and updated as indicated. Interim medical history since our last visit reviewed. Allergies and medications reviewed and updated.  Review of Systems  Constitutional: Negative for chills and fever.  Eyes: Negative for visual disturbance.  Respiratory: Negative for shortness of breath and wheezing.   Cardiovascular: Negative for chest pain and leg swelling.  Musculoskeletal: Negative for back pain and gait problem.  Skin: Negative for rash.  Neurological: Negative for dizziness, weakness and light-headedness.  All other systems reviewed and are negative.   Per HPI unless specifically indicated above   Allergies as of 05/05/2019     No Known Allergies     Medication List       Accurate as of May 05, 2019  4:17 PM. If you have any questions, ask your nurse or doctor.        STOP taking these medications   albuterol 108 (90 Base) MCG/ACT inhaler Commonly known as: VENTOLIN HFA Stopped by: Fransisca Kaufmann Carroll Lingelbach, MD     TAKE these medications   glimepiride 2 MG tablet Commonly known as: AMARYL Take 1 tablet (2 mg total) by mouth daily with breakfast.   Janumet XR 50-1000 MG Tb24 Generic drug: SitaGLIPtin-MetFORMIN HCl Take 1 tablet by mouth 2 (two) times daily.   omeprazole 20 MG tablet Commonly known as: PRILOSEC OTC Take 20 mg by mouth daily as needed. For acid reflux   pravastatin 20 MG tablet Commonly known as: PRAVACHOL Take 1 tablet (20 mg total) by mouth daily with supper.   sildenafil 20 MG tablet Commonly known as: REVATIO Take 1-3 tablets (20-60 mg total) by mouth as needed.        Objective:   BP 133/89   Pulse (!) 110   Temp 98.2 F (36.8 C)   Ht '5\' 10"'$  (1.778 m)   Wt 207 lb 8 oz (94.1 kg)   SpO2 96%   BMI 29.77 kg/m   Wt Readings from Last 3 Encounters:  05/05/19 207 lb 8 oz (94.1 kg)  02/17/19 206 lb (93.4 kg)  12/09/18 200 lb 11.2 oz (91 kg)    Physical Exam Vitals and nursing note reviewed.  Constitutional:  General: He is not in acute distress.    Appearance: He is well-developed. He is not diaphoretic.  Eyes:     General: No scleral icterus.    Conjunctiva/sclera: Conjunctivae normal.  Neck:     Thyroid: No thyromegaly.  Cardiovascular:     Rate and Rhythm: Normal rate and regular rhythm.     Heart sounds: Normal heart sounds. No murmur.  Pulmonary:     Effort: Pulmonary effort is normal. No respiratory distress.     Breath sounds: Normal breath sounds. No wheezing.  Musculoskeletal:        General: Normal range of motion.     Cervical back: Neck supple.  Lymphadenopathy:     Cervical: No cervical adenopathy.  Skin:    General: Skin is warm and  dry.     Findings: No rash.  Neurological:     Mental Status: He is alert and oriented to person, place, and time.     Coordination: Coordination normal.  Psychiatric:        Behavior: Behavior normal.     Results for orders placed or performed in visit on 01/27/19  hgba1c  Result Value Ref Range   HB A1C (BAYER DCA - WAIVED) 7.7 (H) <7.0 %  Microalbumin / creatinine urine ratio  Result Value Ref Range   Creatinine, Urine 97.1 Not Estab. mg/dL   Microalbumin, Urine 6.6 Not Estab. ug/mL   Microalb/Creat Ratio 7 0 - 29 mg/g creat    Assessment & Plan:   Problem List Items Addressed This Visit      Digestive   GERD (gastroesophageal reflux disease)   Relevant Orders   CBC with Differential/Platelet   CMP14+EGFR   Lipid panel   Bayer DCA Hb A1c Waived   TSH     Endocrine   Diabetes mellitus without complication ([place]) - Primary   Relevant Orders   CBC with Differential/Platelet   CMP14+EGFR   Lipid panel   Bayer DCA Hb A1c Waived   TSH   Hyperlipidemia associated with type 2 diabetes mellitus ([place])   Relevant Orders   CBC with Differential/Platelet   CMP14+EGFR   Lipid panel   Bayer DCA Hb A1c Waived   TSH      Continue current medication, A1c 7.2 so much improved. Follow up plan: Return in about 3 months (around 08/05/2019), or if symptoms worsen or fail to improve, for Diabetes recheck.  Counseling provided for all of the vaccine components Orders Placed This Encounter  Procedures  . CBC with Differential/Platelet  . CMP14+EGFR  . Lipid panel  . Bayer DCA Hb A1c Waived  . TSH    [name], MD [facility] 05/05/2019, 4:17 PM

## 2019-05-06 LAB — CMP14+EGFR
ALT: 40 IU/L (ref 0–44)
AST: 37 IU/L (ref 0–40)
Albumin/Globulin Ratio: 1.9 (ref 1.2–2.2)
Albumin: 4.4 g/dL (ref 4.0–5.0)
Alkaline Phosphatase: 41 IU/L (ref 39–117)
BUN/Creatinine Ratio: 15 (ref 9–20)
BUN: 16 mg/dL (ref 6–24)
Bilirubin Total: 0.5 mg/dL (ref 0.0–1.2)
CO2: 20 mmol/L (ref 20–29)
Calcium: 9.9 mg/dL (ref 8.7–10.2)
Chloride: 104 mmol/L (ref 96–106)
Creatinine, Ser: 1.05 mg/dL (ref 0.76–1.27)
GFR calc Af Amer: 96 mL/min/{1.73_m2} (ref >59)
GFR calc non Af Amer: 83 mL/min/{1.73_m2} (ref >59)
Globulin, Total: 2.3 g/dL (ref 1.5–4.5)
Glucose: 141 mg/dL — ABNORMAL HIGH (ref 65–99)
Potassium: 4.2 mmol/L (ref 3.5–5.2)
Sodium: 140 mmol/L (ref 134–144)
Total Protein: 6.7 g/dL (ref 6.0–8.5)

## 2019-05-06 LAB — CBC WITH DIFFERENTIAL/PLATELET
Basophils Absolute: 0.1 10*3/uL (ref 0.0–0.2)
Basos: 1 %
EOS (ABSOLUTE): 0.4 10*3/uL (ref 0.0–0.4)
Eos: 4 %
Hematocrit: 46.5 % (ref 37.5–51.0)
Hemoglobin: 15.6 g/dL (ref 13.0–17.7)
Immature Grans (Abs): 0 10*3/uL (ref 0.0–0.1)
Immature Granulocytes: 0 %
Lymphocytes Absolute: 2.5 10*3/uL (ref 0.7–3.1)
Lymphs: 28 %
MCH: 30.9 pg (ref 26.6–33.0)
MCHC: 33.5 g/dL (ref 31.5–35.7)
MCV: 92 fL (ref 79–97)
Monocytes Absolute: 0.7 10*3/uL (ref 0.1–0.9)
Monocytes: 7 %
Neutrophils Absolute: 5.5 10*3/uL (ref 1.4–7.0)
Neutrophils: 60 %
Platelets: 188 10*3/uL (ref 150–450)
RBC: 5.05 x10E6/uL (ref 4.14–5.80)
RDW: 12.6 % (ref 11.6–15.4)
WBC: 9.2 10*3/uL (ref 3.4–10.8)

## 2019-05-06 LAB — LIPID PANEL
Chol/HDL Ratio: 4.8 ratio (ref 0.0–5.0)
Cholesterol, Total: 178 mg/dL (ref 100–199)
HDL: 37 mg/dL — ABNORMAL LOW (ref >39)
LDL Chol Calc (NIH): 102 mg/dL — ABNORMAL HIGH (ref 0–99)
Triglycerides: 226 mg/dL — ABNORMAL HIGH (ref 0–149)
VLDL Cholesterol Cal: 39 mg/dL (ref 5–40)

## 2019-05-06 LAB — TSH: TSH: 1.89 u[IU]/mL (ref 0.450–4.500)

## 2019-08-11 ENCOUNTER — Other Ambulatory Visit: Payer: Self-pay

## 2019-08-11 ENCOUNTER — Ambulatory Visit: Payer: BC Managed Care – PPO | Admitting: Family Medicine

## 2019-08-11 ENCOUNTER — Encounter: Payer: Self-pay | Admitting: Family Medicine

## 2019-08-11 VITALS — BP 129/83 | HR 96 | Temp 98.2°F | Ht 70.0 in | Wt 204.0 lb

## 2019-08-11 DIAGNOSIS — K219 Gastro-esophageal reflux disease without esophagitis: Secondary | ICD-10-CM

## 2019-08-11 DIAGNOSIS — E119 Type 2 diabetes mellitus without complications: Secondary | ICD-10-CM | POA: Diagnosis not present

## 2019-08-11 DIAGNOSIS — E785 Hyperlipidemia, unspecified: Secondary | ICD-10-CM

## 2019-08-11 DIAGNOSIS — E1169 Type 2 diabetes mellitus with other specified complication: Secondary | ICD-10-CM | POA: Diagnosis not present

## 2019-08-11 LAB — BAYER DCA HB A1C WAIVED: HB A1C (BAYER DCA - WAIVED): 6.6 % (ref <7.0)

## 2019-08-11 MED ORDER — SYNJARDY 12.5-1000 MG PO TABS: 1.0000 | ORAL_TABLET | Freq: Two times a day (BID) | ORAL | 1 refills | Status: DC

## 2019-08-11 NOTE — Progress Notes (Signed)
BP 129/83   Pulse 96   Temp 98.2 F (36.8 C)   Ht 5\' 10"  (1.778 m)   Wt 204 lb (92.5 kg)   SpO2 99%   BMI 29.27 kg/m    Subjective:   Patient ID: Craig Allison, male    DOB: August 23, 1970, 49 y.o.   MRN: [id number]  HPI: Craig Allison is a 49 y.o. male presenting on 08/11/2019 for Medical Management of Chronic Issues, Diabetes, and Hyperlipidemia   HPI Type 2 diabetes mellitus Patient comes in today for recheck of his diabetes. Patient has been currently taking Janumet and glimepiride, patient says he is constipated, A1c is 6.6. Patient is not currently on an ACE inhibitor/ARB. Patient has not seen an ophthalmologist this year. Patient denies any issues with their feet. The symptom started onset as an adult hyperlipidemia and hypertension ARE RELATED TO DM   Hyperlipidemia Patient is coming in for recheck of his hyperlipidemia. The patient is currently taking pravastatin. They deny any issues with myalgias or history of liver damage from it. They deny any focal numbness or weakness or chest pain.   Hypertension Patient is currently on no medication currently and has been diet controlled for hypertension, and their blood pressure today is 129/83. Patient denies any lightheadedness or dizziness. Patient denies headaches, blurred vision, chest pains, shortness of breath, or weakness. Denies any side effects from medication and is content with current medication.   Relevant past medical, surgical, family and social history reviewed and updated as indicated. Interim medical history since our last visit reviewed. Allergies and medications reviewed and updated.  Review of Systems  Constitutional: Negative for chills and fever.  Eyes: Negative for visual disturbance.  Respiratory: Negative for shortness of breath and wheezing.   Cardiovascular: Negative for chest pain and leg swelling.  Musculoskeletal: Negative for back pain and gait problem.  Skin: Negative for rash.  Neurological:  Negative for dizziness, weakness and numbness.  All other systems reviewed and are negative.   Per HPI unless specifically indicated above   Allergies as of 08/11/2019   No Known Allergies     Medication List       Accurate as of August 11, 2019  4:07 PM. If you have any questions, ask your nurse or doctor.        glimepiride 2 MG tablet Commonly known as: AMARYL Take 1 tablet (2 mg total) by mouth daily with breakfast.   Janumet XR 50-1000 MG Tb24 Generic drug: SitaGLIPtin-MetFORMIN HCl Take 1 tablet by mouth 2 (two) times daily.   omeprazole 20 MG tablet Commonly known as: PRILOSEC OTC Take 20 mg by mouth daily as needed. For acid reflux   pravastatin 20 MG tablet Commonly known as: PRAVACHOL Take 1 tablet (20 mg total) by mouth daily with supper.   sildenafil 20 MG tablet Commonly known as: REVATIO Take 1-3 tablets (20-60 mg total) by mouth as needed.        Objective:   BP 129/83   Pulse 96   Temp 98.2 F (36.8 C)   Ht 5\' 10"  (1.778 m)   Wt 204 lb (92.5 kg)   SpO2 99%   BMI 29.27 kg/m   Wt Readings from Last 3 Encounters:  08/11/19 204 lb (92.5 kg)  05/05/19 207 lb 8 oz (94.1 kg)  02/17/19 206 lb (93.4 kg)    Physical Exam Vitals and nursing note reviewed.  Constitutional:      General: He is not in acute distress.  Appearance: He is well-developed. He is not diaphoretic.  Eyes:     General: No scleral icterus.    Conjunctiva/sclera: Conjunctivae normal.  Neck:     Thyroid: No thyromegaly.  Cardiovascular:     Rate and Rhythm: Normal rate and regular rhythm.     Heart sounds: Normal heart sounds. No murmur heard.   Pulmonary:     Effort: Pulmonary effort is normal. No respiratory distress.     Breath sounds: Normal breath sounds. No wheezing.  Musculoskeletal:        General: Normal range of motion.     Cervical back: Neck supple.  Lymphadenopathy:     Cervical: No cervical adenopathy.  Skin:    General: Skin is warm and dry.      Findings: No rash.  Neurological:     Mental Status: He is alert and oriented to person, place, and time.     Coordination: Coordination normal.  Psychiatric:        Behavior: Behavior normal.      Assessment & Plan:   Problem List Items Addressed This Visit      Digestive   GERD (gastroesophageal reflux disease)     Endocrine   Diabetes mellitus without complication (HCC) - Primary   Relevant Medications   Empagliflozin-metFORMIN HCl (SYNJARDY) 12.06-998 MG TABS   Other Relevant Orders   Bayer DCA Hb A1c Waived   Hyperlipidemia associated with type 2 diabetes mellitus (HCC)   Relevant Medications   Empagliflozin-metFORMIN HCl (SYNJARDY) 12.06-998 MG TABS      Patient has been having constipation with Janumet and would like to switch to something else, gave him a sample of Synjardy 12.5 mg-1000 to do twice a day and he will call if it is going well and send a prescription.  A1c is 6.6 today Follow up plan: Return in about 3 months (around 11/11/2019), or if symptoms worsen or fail to improve, for Diabetes recheck.  Counseling provided for all of the vaccine components Orders Placed This Encounter  Procedures  . Bayer [facility] Hb A1c Waived    Arville Care, MD [facility] 08/11/2019, 4:07 PM

## 2019-10-10 ENCOUNTER — Other Ambulatory Visit: Payer: Self-pay | Admitting: Family Medicine

## 2019-10-10 NOTE — Telephone Encounter (Signed)
LMOVM to confirm that patient is no longer taking the Janumet

## 2019-10-11 ENCOUNTER — Other Ambulatory Visit: Payer: Self-pay | Admitting: Family Medicine

## 2019-10-23 ENCOUNTER — Encounter: Payer: Self-pay | Admitting: Family Medicine

## 2019-10-23 ENCOUNTER — Ambulatory Visit: Payer: BC Managed Care – PPO | Admitting: Family Medicine

## 2019-10-23 ENCOUNTER — Other Ambulatory Visit: Payer: Self-pay

## 2019-10-23 VITALS — BP 157/89 | HR 98 | Temp 97.0°F | Ht 70.0 in | Wt 207.0 lb

## 2019-10-23 DIAGNOSIS — R5383 Other fatigue: Secondary | ICD-10-CM | POA: Diagnosis not present

## 2019-10-23 DIAGNOSIS — R42 Dizziness and giddiness: Secondary | ICD-10-CM

## 2019-10-23 DIAGNOSIS — E119 Type 2 diabetes mellitus without complications: Secondary | ICD-10-CM

## 2019-10-23 NOTE — Progress Notes (Signed)
BP (!) 157/89   Pulse 98   Temp (!) 97 F (36.1 C)   Ht _0  (1.778 m)   Wt 207 lb (93.9 kg)   SpO2 99%   BMI 29.70 kg/m    Subjective:   Patient ID: Craig Allison, male    DOB: 1970/12/10, 49 y.o.   MRN: [id number]  HPI: Craig Allison is a 49 y.o. male presenting on 10/23/2019 for Diabetes, Fatigue, and Dizziness   HPI  Patient is coming in complaining of episodes of excessive sweating and hypoglycemia and feeling lightheaded and dizzy and feeling weak and drained and these episodes will happen when his blood sugars are down and then he will have to eat something to get it to come back up.  Only checks maybe once a day and so he does not know for sure but he says he has gotten some numbers in the mornings in the 50s and 60s and sometimes in the 70s and 90s the 90 works and will sweat and it feels like it goes down and then he will have to drink and eat a lot to get it to go back up.  He says he has had to have find some chocolate and other things throughout the day just to get it to go back up.  He says he does admit to having a Dr. [name] before the evenings.  He says he is not taking the Synjardy and is taking the glimepiride and Janumet.  He says his episodes have been going on more over the past few weeks.  Relevant past medical, surgical, family and social history reviewed and updated as indicated. Interim medical history since our last visit reviewed. Allergies and medications reviewed and updated.  Review of Systems  Constitutional: Positive for fatigue. Negative for chills and fever.  Respiratory: Negative for shortness of breath and wheezing.   Cardiovascular: Negative for chest pain and leg swelling.  Musculoskeletal: Negative for back pain and gait problem.  Skin: Negative for rash.  Neurological: Positive for dizziness and weakness. Negative for light-headedness.  All other systems reviewed and are negative.   Per HPI unless specifically indicated  above   Allergies as of 10/23/2019   No Known Allergies     Medication List       Accurate as of October 23, 2019 11:11 AM. If you have any questions, ask your nurse or doctor.        STOP taking these medications   Synjardy 12.06-998 MG Tabs Generic drug: Empagliflozin-metFORMIN HCl Stopped by: Fransisca Kaufmann Mikal Blasdell, MD     TAKE these medications   glimepiride 2 MG tablet Commonly known as: AMARYL Take 1 tablet (2 mg total) by mouth daily with breakfast.   Janumet XR 50-1000 MG Tb24 Generic drug: SitaGLIPtin-MetFORMIN HCl Take 1 tablet by mouth 2 (two) times daily.   omeprazole 20 MG tablet Commonly known as: PRILOSEC OTC Take 20 mg by mouth daily as needed. For acid reflux   pravastatin 20 MG tablet Commonly known as: PRAVACHOL TAKE 1 TABLET (20 MG TOTAL) BY MOUTH DAILY WITH SUPPER.   sildenafil 20 MG tablet Commonly known as: REVATIO Take 1-3 tablets (20-60 mg total) by mouth as needed.        Objective:   BP (!) 157/89   Pulse 98   Temp (!) 97 F (36.1 C)   Ht _1  (1.778 m)   Wt 207 lb (93.9 kg)   SpO2 99%   BMI 29.70 kg/m  Wt Readings from Last 3 Encounters:  10/23/19 207 lb (93.9 kg)  08/11/19 204 lb (92.5 kg)  05/05/19 207 lb 8 oz (94.1 kg)    Physical Exam Vitals and nursing note reviewed.  Constitutional:      General: He is not in acute distress.    Appearance: He is well-developed. He is not diaphoretic.  Eyes:     General: No scleral icterus.    Conjunctiva/sclera: Conjunctivae normal.  Neck:     Thyroid: No thyromegaly.  Cardiovascular:     Rate and Rhythm: Normal rate and regular rhythm.     Heart sounds: Normal heart sounds. No murmur heard.   Pulmonary:     Effort: Pulmonary effort is normal. No respiratory distress.     Breath sounds: Normal breath sounds. No wheezing.  Musculoskeletal:     Cervical back: Neck supple.  Lymphadenopathy:     Cervical: No cervical adenopathy.  Skin:    General: Skin is warm and dry.      Findings: No rash.  Neurological:     Mental Status: He is alert and oriented to person, place, and time.     Coordination: Coordination normal.  Psychiatric:        Behavior: Behavior normal.       Assessment & Plan:   Problem List Items Addressed This Visit      Endocrine   Diabetes mellitus without complication ([place]) - Primary   Relevant Orders   BMP8+EGFR    Other Visit Diagnoses    Other fatigue       Relevant Orders   BMP8+EGFR   Dizziness       Relevant Orders   BMP8+EGFR    Patient sounds like he is having some hyperglycemia and hypoglycemic episodes although he has not been checking as exactly, only checks maybe once a day and so he does not know for sure but he says he has gotten some numbers in the mornings in the 50s and 60s and sometimes in the 70s and 90s the 74 works and will sweat and it feels like it goes down and then he will have to drink and eat a lot to get it to go back up.  He says he has had to have find some chocolate and other things throughout the day just to get it to go back up.  He says he does admit to having a Dr. [name] before the evenings.  He says he is not taking the Synjardy and is taking the glimepiride and Janumet  Follow up plan: Return in about 4 weeks (around 11/20/2019), or if symptoms worsen or fail to improve, for Diabetes checkup.  Counseling provided for all of the vaccine components No orders of the defined types were placed in this encounter.   [name], MD [facility] 10/23/2019, 11:11 AM

## 2019-10-24 LAB — BMP8+EGFR
BUN/Creatinine Ratio: 13 (ref 9–20)
BUN: 12 mg/dL (ref 6–24)
CO2: 22 mmol/L (ref 20–29)
Calcium: 10.4 mg/dL — ABNORMAL HIGH (ref 8.7–10.2)
Chloride: 106 mmol/L (ref 96–106)
Creatinine, Ser: 0.89 mg/dL (ref 0.76–1.27)
GFR calc Af Amer: 116 mL/min/{1.73_m2} (ref >59)
GFR calc non Af Amer: 100 mL/min/{1.73_m2} (ref >59)
Glucose: 136 mg/dL — ABNORMAL HIGH (ref 65–99)
Potassium: 4.4 mmol/L (ref 3.5–5.2)
Sodium: 142 mmol/L (ref 134–144)

## 2019-11-03 ENCOUNTER — Ambulatory Visit: Payer: BC Managed Care – PPO | Admitting: Family Medicine

## 2019-11-06 ENCOUNTER — Ambulatory Visit: Payer: BC Managed Care – PPO | Admitting: Pharmacist

## 2019-11-06 ENCOUNTER — Other Ambulatory Visit: Payer: Self-pay

## 2019-11-06 DIAGNOSIS — N183 Chronic kidney disease, stage 3 unspecified: Secondary | ICD-10-CM

## 2019-11-06 DIAGNOSIS — E1122 Type 2 diabetes mellitus with diabetic chronic kidney disease: Secondary | ICD-10-CM

## 2019-11-06 MED ORDER — METFORMIN HCL ER 500 MG PO TB24: 1000.0000 mg | ORAL_TABLET | Freq: Two times a day (BID) | ORAL | 1 refills | Status: DC

## 2019-11-06 NOTE — Progress Notes (Signed)
    11/06/2019 Name: Craig Allison MRN: [id number] DOB: August 25, 1970   S:  19 yoM Presents for diabetes evaluation, education, and management Patient was referred and last seen by Primary Care Provider on 10/23/19  Insurance coverage/medication affordability: BC[employer] commercial  Patient reports adherence with medications. . Current diabetes medications include: janumet XR, glimepiride . Current hypertension medications include: n/a Goal 130/80 . Current hyperlipidemia medications include: pravastatin   Patient reports hypoglycemic events.   Patient reported dietary habits: Eats 3 meals/day The patient is asked to make an attempt to improve diet and exercise patterns to aid in medical management of this problem. Discussed meal planning options and Plate method for healthy eating . Avoid sugary drinks and desserts . Incorporate balanced protein, non starchy veggies, 1 serving of carbohydrate with each meal . Increase water intake . Increase physical activity as able  Eats b/t 12-2pm lunch Eats 9-10 am breakfast--candy bar, honey bun Drinks: gatorade, coffee (AM), dp  Patient-reported exercise habits: push mows for exercise   O:  Lab Results  Component Value Date   HGBA1C 6.6 08/11/2019    Lipid Panel     Component Value Date/Time   CHOL 178 05/05/2019 1615   TRIG 226 (H) 05/05/2019 1615   HDL 37 (L) 05/05/2019 1615   CHOLHDL 4.8 05/05/2019 1615   LDLCALC 102 (H) 05/05/2019 1615   Patient used libre CGM     A/P:  Diabetes T2DM, A1c currently 6.6%.  Patient experiencing hypoglycemia.  Patient is adherent with medication. Control is suboptimal due to inconsistent meals and patient sometimes misses janumet doses (approximately 1 pill per week)  -Stop glimepiride to help avoid hypoglycemia  -Continue metformin (GFR 100)  -Encouraged patient to eat 3 steady meals (being mindful of carbohydrates)  -Extensively discussed pathophysiology of diabetes, recommended  lifestyle interventions, dietary effects on blood sugar control  -Counseled on s/sx of and management of hypoglycemia  -Next A1C anticipated 01/14/2020.    Written patient instructions provided.  Total time in face to face counseling 30 minutes.   Follow up PCP Clinic Visit ON 01/14/20.

## 2019-12-02 ENCOUNTER — Other Ambulatory Visit: Payer: Self-pay | Admitting: Family Medicine

## 2019-12-17 ENCOUNTER — Other Ambulatory Visit: Payer: Self-pay | Admitting: Family Medicine

## 2019-12-17 ENCOUNTER — Encounter: Payer: Self-pay | Admitting: Family Medicine

## 2019-12-18 MED ORDER — JANUMET XR 50-1000 MG PO TB24: 1.0000 | ORAL_TABLET | Freq: Two times a day (BID) | ORAL | 3 refills | Status: DC

## 2020-01-24 ENCOUNTER — Other Ambulatory Visit: Payer: Self-pay

## 2020-01-24 ENCOUNTER — Encounter: Payer: Self-pay | Admitting: Family Medicine

## 2020-01-24 ENCOUNTER — Ambulatory Visit: Payer: BC Managed Care – PPO | Admitting: Family Medicine

## 2020-01-24 VITALS — BP 133/88 | HR 109 | Temp 97.4°F | Ht 70.0 in | Wt 202.0 lb

## 2020-01-24 DIAGNOSIS — E1122 Type 2 diabetes mellitus with diabetic chronic kidney disease: Secondary | ICD-10-CM

## 2020-01-24 DIAGNOSIS — K219 Gastro-esophageal reflux disease without esophagitis: Secondary | ICD-10-CM

## 2020-01-24 DIAGNOSIS — E785 Hyperlipidemia, unspecified: Secondary | ICD-10-CM | POA: Diagnosis not present

## 2020-01-24 DIAGNOSIS — N183 Chronic kidney disease, stage 3 unspecified: Secondary | ICD-10-CM

## 2020-01-24 DIAGNOSIS — E1169 Type 2 diabetes mellitus with other specified complication: Secondary | ICD-10-CM

## 2020-01-24 LAB — BAYER DCA HB A1C WAIVED: HB A1C (BAYER DCA - WAIVED): 8.7 % — ABNORMAL HIGH (ref <7.0)

## 2020-01-24 MED ORDER — PRAVASTATIN SODIUM 20 MG PO TABS
20.0000 mg | ORAL_TABLET | Freq: Every day | ORAL | 3 refills | Status: DC
Start: 2020-01-24 — End: 2020-09-19

## 2020-01-24 MED ORDER — RYBELSUS 7 MG PO TABS: 7.0000 mg | ORAL_TABLET | Freq: Every day | ORAL | 3 refills | Status: DC

## 2020-01-24 MED ORDER — METFORMIN HCL ER 500 MG PO TB24: 1000.0000 mg | ORAL_TABLET | Freq: Every day | ORAL | 3 refills | Status: DC

## 2020-01-24 NOTE — Progress Notes (Signed)
BP 133/88   Pulse (!) 109   Temp (!) 97.4 F (36.3 C)   Ht 5' 10" (1.778 m)   Wt 202 lb (91.6 kg)   SpO2 99%   BMI 28.98 kg/m    Subjective:   Patient ID: Craig Allison, male    DOB: January 17, 1971, 49 y.o.   MRN: [id number]  HPI: Craig Allison is a 49 y.o. male presenting on 01/24/2020 for Medical Management of Chronic Issues and Diabetes   HPI Type 2 diabetes mellitus Patient comes in today for recheck of his diabetes. Patient has been currently taking Janumet XR, A1c is 8.6. Patient is not currently on an ACE inhibitor/ARB. Patient has not seen an ophthalmologist this year. Patient denies any issues with their feet. The symptom started onset as an adult hyperlipidemia and GERD ARE RELATED TO DM   Hyperlipidemia Patient is coming in for recheck of his hyperlipidemia. The patient is currently taking pravastatin. They deny any issues with myalgias or history of liver damage from it. They deny any focal numbness or weakness or chest pain.   GERD Patient is currently on omeprazole.  She denies any major symptoms or abdominal pain or belching or burping. She denies any blood in her stool or lightheadedness or dizziness.   Relevant past medical, surgical, family and social history reviewed and updated as indicated. Interim medical history since our last visit reviewed. Allergies and medications reviewed and updated.  Review of Systems  Constitutional: Negative for chills and fever.  Eyes: Negative for visual disturbance.  Respiratory: Negative for shortness of breath and wheezing.   Cardiovascular: Negative for chest pain and leg swelling.  Musculoskeletal: Negative for back pain and gait problem.  Skin: Negative for rash.  Neurological: Negative for dizziness, weakness and numbness.  All other systems reviewed and are negative.   Per HPI unless specifically indicated above   Allergies as of 01/24/2020   No Known Allergies     Medication List       Accurate as of  January 24, 2020  3:48 PM. If you have any questions, ask your nurse or doctor.        STOP taking these medications   sildenafil 20 MG tablet Commonly known as: REVATIO Stopped by: Fransisca Kaufmann Dettinger, MD     TAKE these medications   Janumet XR 50-1000 MG Tb24 Generic drug: SitaGLIPtin-MetFORMIN HCl Take 1 tablet by mouth in the morning and at bedtime.   omeprazole 20 MG tablet Commonly known as: PRILOSEC OTC Take 20 mg by mouth daily as needed. For acid reflux   pravastatin 20 MG tablet Commonly known as: PRAVACHOL TAKE 1 TABLET (20 MG TOTAL) BY MOUTH DAILY WITH SUPPER.        Objective:   BP 133/88   Pulse (!) 109   Temp (!) 97.4 F (36.3 C)   Ht 5' 10" (1.778 m)   Wt 202 lb (91.6 kg)   SpO2 99%   BMI 28.98 kg/m   Wt Readings from Last 3 Encounters:  01/24/20 202 lb (91.6 kg)  10/23/19 207 lb (93.9 kg)  08/11/19 204 lb (92.5 kg)    Physical Exam Vitals and nursing note reviewed.  Constitutional:      General: He is not in acute distress.    Appearance: He is well-developed and well-nourished. He is not diaphoretic.  Eyes:     General: No scleral icterus.    Extraocular Movements: EOM normal.     Conjunctiva/sclera: Conjunctivae normal.  Neck:     Thyroid: No thyromegaly.  Cardiovascular:     Rate and Rhythm: Normal rate and regular rhythm.     Pulses: Intact distal pulses.     Heart sounds: Normal heart sounds. No murmur heard.   Pulmonary:     Effort: Pulmonary effort is normal. No respiratory distress.     Breath sounds: Normal breath sounds. No wheezing.  Musculoskeletal:        General: No edema. Normal range of motion.     Cervical back: Neck supple.  Lymphadenopathy:     Cervical: No cervical adenopathy.  Skin:    General: Skin is warm and dry.     Findings: No rash.  Neurological:     Mental Status: He is alert and oriented to person, place, and time.     Coordination: Coordination normal.  Psychiatric:        Mood and Affect: Mood  and affect normal.        Behavior: Behavior normal.       Assessment & Plan:   Problem List Items Addressed This Visit      Digestive   GERD (gastroesophageal reflux disease)   Relevant Orders   CBC with Differential/Platelet     Endocrine   Type 2 diabetes mellitus with other specified complication (HCC)   Relevant Medications   pravastatin (PRAVACHOL) 20 MG tablet   metFORMIN (GLUCOPHAGE-XR) 500 MG 24 hr tablet   Semaglutide (RYBELSUS) 7 MG TABS   Other Relevant Orders   CBC with Differential/Platelet   CMP14+EGFR   Hyperlipidemia associated with type 2 diabetes mellitus (HCC)   Relevant Medications   pravastatin (PRAVACHOL) 20 MG tablet   metFORMIN (GLUCOPHAGE-XR) 500 MG 24 hr tablet   Semaglutide (RYBELSUS) 7 MG TABS   Other Relevant Orders   Lipid panel    Other Visit Diagnoses    Diabetes mellitus with stage 3 chronic kidney disease (HCC)    -  Primary   Relevant Medications   pravastatin (PRAVACHOL) 20 MG tablet   metFORMIN (GLUCOPHAGE-XR) 500 MG 24 hr tablet   Semaglutide (RYBELSUS) 7 MG TABS   Other Relevant Orders   Bayer DCA Hb A1c Waived      We will switch him from Janumet to Metformin plus Rybelsus, gave a sample of the 3 mg and will have him go up to the 10 mg after he finishes the sample.  Continue pravastatin.  Gave coupon card as well. Follow up plan: Return in about 3 months (around 04/23/2020), or if symptoms worsen or fail to improve, for Diabetes and cholesterol recheck.  Counseling provided for all of the vaccine components Orders Placed This Encounter  Procedures  . Bayer [facility] Hb A1c Waived    [name], MD [facility] 01/24/2020, 3:48 PM

## 2020-01-25 LAB — CBC WITH DIFFERENTIAL/PLATELET
Basophils Absolute: 0.1 10*3/uL (ref 0.0–0.2)
Basos: 1 %
EOS (ABSOLUTE): 0.3 10*3/uL (ref 0.0–0.4)
Eos: 5 %
Hematocrit: 42.5 % (ref 37.5–51.0)
Hemoglobin: 14.7 g/dL (ref 13.0–17.7)
Immature Grans (Abs): 0 10*3/uL (ref 0.0–0.1)
Immature Granulocytes: 0 %
Lymphocytes Absolute: 2.6 10*3/uL (ref 0.7–3.1)
Lymphs: 40 %
MCH: 31.5 pg (ref 26.6–33.0)
MCHC: 34.6 g/dL (ref 31.5–35.7)
MCV: 91 fL (ref 79–97)
Monocytes Absolute: 0.5 10*3/uL (ref 0.1–0.9)
Monocytes: 8 %
Neutrophils Absolute: 3 10*3/uL (ref 1.4–7.0)
Neutrophils: 46 %
Platelets: 166 10*3/uL (ref 150–450)
RBC: 4.67 x10E6/uL (ref 4.14–5.80)
RDW: 12.3 % (ref 11.6–15.4)
WBC: 6.6 10*3/uL (ref 3.4–10.8)

## 2020-01-25 LAB — CMP14+EGFR
ALT: 30 IU/L (ref 0–44)
AST: 23 IU/L (ref 0–40)
Albumin/Globulin Ratio: 1.7 (ref 1.2–2.2)
Albumin: 4.3 g/dL (ref 4.0–5.0)
Alkaline Phosphatase: 43 IU/L — ABNORMAL LOW (ref 44–121)
BUN/Creatinine Ratio: 11 (ref 9–20)
BUN: 12 mg/dL (ref 6–24)
Bilirubin Total: 0.5 mg/dL (ref 0.0–1.2)
CO2: 24 mmol/L (ref 20–29)
Calcium: 9.8 mg/dL (ref 8.7–10.2)
Chloride: 105 mmol/L (ref 96–106)
Creatinine, Ser: 1.13 mg/dL (ref 0.76–1.27)
GFR calc Af Amer: 88 mL/min/{1.73_m2} (ref >59)
GFR calc non Af Amer: 76 mL/min/{1.73_m2} (ref >59)
Globulin, Total: 2.5 g/dL (ref 1.5–4.5)
Glucose: 142 mg/dL — ABNORMAL HIGH (ref 65–99)
Potassium: 4.2 mmol/L (ref 3.5–5.2)
Sodium: 141 mmol/L (ref 134–144)
Total Protein: 6.8 g/dL (ref 6.0–8.5)

## 2020-01-25 LAB — LIPID PANEL
Chol/HDL Ratio: 4.7 ratio (ref 0.0–5.0)
Cholesterol, Total: 170 mg/dL (ref 100–199)
HDL: 36 mg/dL — ABNORMAL LOW (ref >39)
LDL Chol Calc (NIH): 106 mg/dL — ABNORMAL HIGH (ref 0–99)
Triglycerides: 161 mg/dL — ABNORMAL HIGH (ref 0–149)
VLDL Cholesterol Cal: 28 mg/dL (ref 5–40)

## 2020-02-21 ENCOUNTER — Other Ambulatory Visit: Payer: PRIVATE HEALTH INSURANCE

## 2020-02-21 DIAGNOSIS — Z20822 Contact with and (suspected) exposure to covid-19: Secondary | ICD-10-CM | POA: Diagnosis not present

## 2020-02-23 LAB — NOVEL CORONAVIRUS, NAA: SARS-CoV-2, NAA: DETECTED — AB

## 2020-02-23 LAB — SARS-COV-2, NAA 2 DAY TAT

## 2020-03-22 ENCOUNTER — Encounter: Payer: Self-pay | Admitting: *Deleted

## 2020-04-15 IMAGING — DX DG CHEST 1V
1 series · 1 of 1 positions shown · non-contrast
Comparison: Chest radiographs 01/31/2011.

CLINICAL DATA: 47-year-old male undergoing physical exam.  Smoker.

EXAM:
CHEST  1 VIEW

[chest pa]
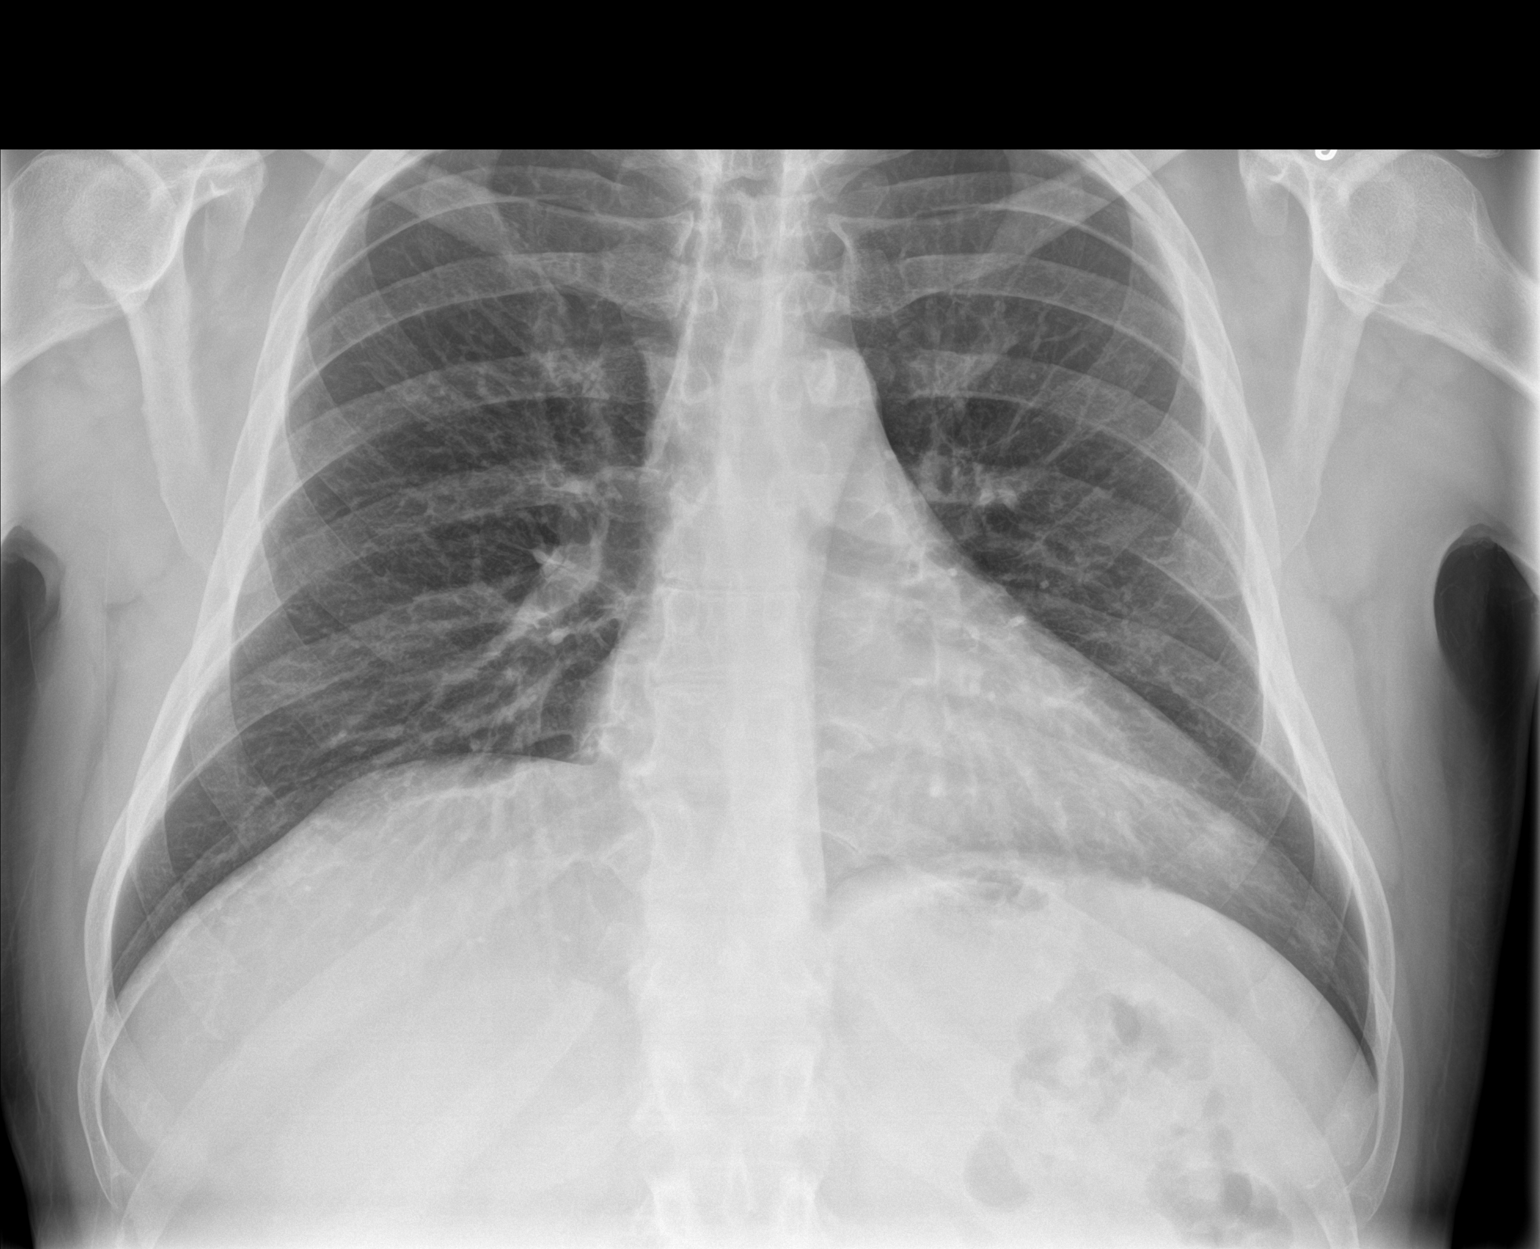

[1 of 1 positions shown; findings below may reference images not displayed]

FINDINGS: Lung volumes and mediastinal contours are stable and within normal
limits. Visualized tracheal air column is within normal limits. Lung
parenchyma is stable since 7047 and clear. No pneumothorax or
pleural effusion. Negative visible bowel gas pattern. No acute
osseous abnormality identified.
IMPRESSION: Stable since 7047 and negative.

## 2020-05-29 ENCOUNTER — Ambulatory Visit: Payer: BC Managed Care – PPO | Admitting: Family Medicine

## 2020-06-04 ENCOUNTER — Ambulatory Visit: Payer: BC Managed Care – PPO | Admitting: Family Medicine

## 2020-09-19 ENCOUNTER — Ambulatory Visit (INDEPENDENT_AMBULATORY_CARE_PROVIDER_SITE_OTHER): Payer: BC Managed Care – PPO | Admitting: Pharmacist

## 2020-09-19 ENCOUNTER — Other Ambulatory Visit: Payer: Self-pay

## 2020-09-19 DIAGNOSIS — E785 Hyperlipidemia, unspecified: Secondary | ICD-10-CM | POA: Diagnosis not present

## 2020-09-19 DIAGNOSIS — E1169 Type 2 diabetes mellitus with other specified complication: Secondary | ICD-10-CM | POA: Diagnosis not present

## 2020-09-19 LAB — BAYER DCA HB A1C WAIVED: HB A1C (BAYER DCA - WAIVED): >14 % — ABNORMAL HIGH (ref <7.0)

## 2020-09-19 MED ORDER — PRAVASTATIN SODIUM 20 MG PO TABS: 20.0000 mg | ORAL_TABLET | Freq: Every day | ORAL | 3 refills | Status: DC

## 2020-09-19 MED ORDER — TIRZEPATIDE 5 MG/0.5ML ~~LOC~~ SOAJ: 5.0000 mg | SUBCUTANEOUS | 2 refills | Status: DC

## 2020-09-19 NOTE — Progress Notes (Signed)
    09/19/2020 Name: Craig Allison MRN: [id number] DOB: Feb 04, 1971   S:  50 yoM Presents for diabetes evaluation, education, and management.  He reports he has not been taking medication as prescribed due to cost/high deductible plans.  Insurance coverage/medication affordability: BCBS high deductible  Patient denies adherence with medications. Current diabetes medications include: n/a Current hypertension medications include: n/a Goal 130/80 Current hyperlipidemia medications include: n/a  Patient reported dietary habits: Eats 3 meals/day Discussed meal planning options and Plate method for healthy eating Avoid sugary drinks and desserts Incorporate balanced protein, non starchy veggies, 1 serving of carbohydrate with each meal Increase water intake Increase physical activity as able  Patient-reported exercise habits: n/a  O:  Lab Results  Component Value Date   HGBA1C >14.0 (H) 09/19/2020     Lipid Panel     Component Value Date/Time   CHOL 170 01/24/2020 1633   TRIG 161 (H) 01/24/2020 1633   HDL 36 (L) 01/24/2020 1633   CHOLHDL 4.7 01/24/2020 1633   LDLCALC 106 (H) 01/24/2020 1633    Home fasting blood sugars: n/a--doesn't check  2 hour post-meal/random blood sugars: n/a.    Clinical Atherosclerotic Cardiovascular Disease (ASCVD): No   The 10-year ASCVD risk score Denman George DC Jr., et al., 2013) is: 16.9%   Values used to calculate the score:     Age: 81 years     Sex: Male     Is Non-Hispanic African American: No     Diabetic: Yes     Tobacco smoker: Yes     Systolic Blood Pressure: 133 mmHg     Is BP treated: No     HDL Cholesterol: 36 mg/dL     Total Cholesterol: 170 mg/dL   A/P:  Diabetes Z3GD currently uncontrolled, a1c>14. Patient is not adherent with medication. Control is suboptimal due to cost/lifestyle.  -Start mounjaro 2.5mg  sq weekly  Denies personal and family history of Medullary thyroid cancer (MTC)  Voucher given to patient for  $25  Sample pack started; patient demonstrated injection during office visit--will plan to increase to 5mg  in 4 weeks    -Extensively discussed pathophysiology of diabetes, recommended lifestyle interventions, dietary effects on blood sugar control  -Counseled on s/sx of and management of hypoglycemia  -Next A1C anticipated 3 monts.   Written patient instructions provided.  Total time in face to face counseling 30 minutes.   Follow up Pharmacist in 4 weeks  , PharmD, BCPS Clinical Pharmacist, [facility]  II Phone [phone]

## 2020-10-18 ENCOUNTER — Ambulatory Visit (INDEPENDENT_AMBULATORY_CARE_PROVIDER_SITE_OTHER): Payer: BC Managed Care – PPO | Admitting: Pharmacist

## 2020-10-18 DIAGNOSIS — E1169 Type 2 diabetes mellitus with other specified complication: Secondary | ICD-10-CM | POA: Diagnosis not present

## 2020-10-18 NOTE — Progress Notes (Signed)
    10/18/2020 Name: Craig Allison MRN: [id number] DOB: 1970-03-20   S:  50 yoM Presents for diabetes evaluation, education, and management.  He reports he has not been taking medication as prescribed due to cost/high deductible plans.   Insurance coverage/medication affordability: BCBS high deductible   Patient denies adherence with medications. Current diabetes medications include: mounjaro Current hypertension medications include: n/a Goal 130/80 Current hyperlipidemia medications include: pravastatin   Patient reported dietary habits: Eats 3 meals/day Discussed meal planning options and Plate method for healthy eating Avoid sugary drinks and desserts Incorporate balanced protein, non starchy veggies, 1 serving of carbohydrate with each meal Increase water intake Increase physical activity as able   Patient-reported exercise habits: n/a    O:  Lab Results  Component Value Date   HGBA1C >14.0 (H) 09/19/2020    Lipid Panel     Component Value Date/Time   CHOL 170 01/24/2020 1633   TRIG 161 (H) 01/24/2020 1633   HDL 36 (L) 01/24/2020 1633   CHOLHDL 4.7 01/24/2020 1633   LDLCALC 106 (H) 01/24/2020 1633     Home fasting blood sugars: 200s  2 hour post-meal/random blood sugars: n/a.    Clinical Atherosclerotic Cardiovascular Disease (ASCVD): No   The 10-year ASCVD risk score (Arnett DK, et al., 2019) is: 16.9%   Values used to calculate the score:     Age: 87 years     Sex: Male     Is Non-Hispanic African American: No     Diabetic: Yes     Tobacco smoker: Yes     Systolic Blood Pressure: 133 mmHg     Is BP treated: No     HDL Cholesterol: 36 mg/dL     Total Cholesterol: 170 mg/dL    A/P:    Diabetes C6CB currently uncontrolled, a1c>14. Patient is not adherent with medication. Control is suboptimal due to cost/lifestyle.  Bgs down to 200s (was 300-400)   -Increase mounjaro  to 5mg  sq weekly             Denies personal and family history of Medullary  thyroid cancer (MTC)             Voucher given to patient for $25             Sample pack started; patient demonstrated injection during office visit--will plan to increase to 7.5mg  in 4 weeks as tolerated               -Extensively discussed pathophysiology of diabetes, recommended lifestyle interventions, dietary effects on blood sugar control  -Counseled on s/sx of and management of hypoglycemia  -Next A1C anticipated 3 months.  Written patient instructions provided.  Total time in face to face counseling 20 minutes.   Follow up Pharmacist in 4 weeks  , PharmD, BCPS Clinical Pharmacist, [facility]  II Phone [phone]

## 2020-10-22 ENCOUNTER — Ambulatory Visit: Payer: Self-pay | Admitting: Pharmacist

## 2020-12-05 ENCOUNTER — Telehealth: Payer: Self-pay | Admitting: *Deleted

## 2020-12-05 MED ORDER — TIRZEPATIDE 7.5 MG/0.5ML ~~LOC~~ SOAJ: 7.5000 mg | SUBCUTANEOUS | 3 refills | Status: DC

## 2020-12-05 NOTE — Addendum Note (Signed)
Addended by: Vanice Sarah D on: 12/05/2020 10:34 AM   Modules accepted: Orders

## 2020-12-05 NOTE — Telephone Encounter (Signed)
VM from pt for Craig Allison, he would like to increase his shot, Mounjaro to 7 mg from 5

## 2020-12-05 NOTE — Telephone Encounter (Signed)
Patient tolerating Mounjaro well Increase to 7.5mg  from 5mg  weekly Denies personal and family history of Medullary thyroid cancer (MTC) Sent to pcp for cosign Sent to the drug store in [place] (they have his copay card on file)

## 2021-02-12 ENCOUNTER — Ambulatory Visit (INDEPENDENT_AMBULATORY_CARE_PROVIDER_SITE_OTHER): Payer: BC Managed Care – PPO | Admitting: Family Medicine

## 2021-02-12 ENCOUNTER — Encounter: Payer: Self-pay | Admitting: Family Medicine

## 2021-02-12 VITALS — BP 115/79 | HR 103 | Ht 70.0 in | Wt 184.0 lb

## 2021-02-12 DIAGNOSIS — E785 Hyperlipidemia, unspecified: Secondary | ICD-10-CM

## 2021-02-12 DIAGNOSIS — E1169 Type 2 diabetes mellitus with other specified complication: Secondary | ICD-10-CM | POA: Diagnosis not present

## 2021-02-12 DIAGNOSIS — K219 Gastro-esophageal reflux disease without esophagitis: Secondary | ICD-10-CM | POA: Diagnosis not present

## 2021-02-12 LAB — BAYER DCA HB A1C WAIVED: HB A1C (BAYER DCA - WAIVED): 6.6 % — ABNORMAL HIGH (ref 4.8–5.6)

## 2021-02-12 MED ORDER — TIRZEPATIDE 7.5 MG/0.5ML ~~LOC~~ SOAJ: 7.5000 mg | SUBCUTANEOUS | 3 refills | Status: DC

## 2021-02-12 NOTE — Progress Notes (Signed)
BP 115/79    Pulse (!) 103    Ht $R'5\' 10"'om$  (1.778 m)    Wt 184 lb (83.5 kg)    SpO2 98%    BMI 26.40 kg/m    Subjective:   Patient ID: Craig Allison, male    DOB: December 18, 1970, 51 y.o.   MRN: [id number]  HPI: Craig Allison is a 51 y.o. male presenting on 02/12/2021 for Medical Management of Chronic Issues, Diabetes, and Hyperlipidemia   HPI Type 2 diabetes mellitus Patient comes in today for recheck of his diabetes. Patient has been currently taking Mounjaro and A1c looks great at 6.6. Patient is not currently on an ACE inhibitor/ARB. Patient has not seen an ophthalmologist this year. Patient denies any issues with their feet. The symptom started onset as an adult hyperlipidemia ARE RELATED TO DM   Hyperlipidemia Patient is coming in for recheck of his hyperlipidemia. The patient is currently taking pravastatin. They deny any issues with myalgias or history of liver damage from it. They deny any focal numbness or weakness or chest pain.   GERD Patient is currently on none currently, doing well.  She denies any major symptoms or abdominal pain or belching or burping. She denies any blood in her stool or lightheadedness or dizziness.   Relevant past medical, surgical, family and social history reviewed and updated as indicated. Interim medical history since our last visit reviewed. Allergies and medications reviewed and updated.  Review of Systems  Constitutional:  Negative for chills and fever.  Eyes:  Negative for visual disturbance.  Respiratory:  Negative for shortness of breath and wheezing.   Cardiovascular:  Negative for chest pain and leg swelling.  Musculoskeletal:  Negative for back pain and gait problem.  Skin:  Negative for rash.  Neurological:  Negative for dizziness, weakness and light-headedness.  All other systems reviewed and are negative.  Per HPI unless specifically indicated above   Allergies as of 02/12/2021   No Known Allergies      Medication List         Accurate as of February 12, 2021  1:50 PM. If you have any questions, ask your nurse or doctor.          STOP taking these medications    metFORMIN 500 MG 24 hr tablet Commonly known as: GLUCOPHAGE-XR Stopped by: Fransisca Kaufmann Fenton Candee, MD   omeprazole 20 MG tablet Commonly known as: PRILOSEC OTC Stopped by: Fransisca Kaufmann Cathalina Barcia, MD       TAKE these medications    pravastatin 20 MG tablet Commonly known as: PRAVACHOL Take 1 tablet (20 mg total) by mouth daily with supper.   tirzepatide 7.5 MG/0.5ML Pen Commonly known as: MOUNJARO Inject 7.5 mg into the skin once a week.         Objective:   BP 115/79    Pulse (!) 103    Ht $R'5\' 10"'Qq$  (1.778 m)    Wt 184 lb (83.5 kg)    SpO2 98%    BMI 26.40 kg/m   Wt Readings from Last 3 Encounters:  02/12/21 184 lb (83.5 kg)  01/24/20 202 lb (91.6 kg)  10/23/19 207 lb (93.9 kg)    Physical Exam Vitals and nursing note reviewed.  Constitutional:      General: He is not in acute distress.    Appearance: He is well-developed. He is not diaphoretic.  Eyes:     General: No scleral icterus.    Conjunctiva/sclera: Conjunctivae normal.  Neck:  Thyroid: No thyromegaly.  Cardiovascular:     Rate and Rhythm: Normal rate and regular rhythm.     Heart sounds: Normal heart sounds. No murmur heard. Pulmonary:     Effort: Pulmonary effort is normal. No respiratory distress.     Breath sounds: Normal breath sounds. No wheezing.  Musculoskeletal:        General: No swelling. Normal range of motion.     Cervical back: Neck supple.  Lymphadenopathy:     Cervical: No cervical adenopathy.  Skin:    General: Skin is warm and dry.     Findings: No rash.  Neurological:     Mental Status: He is alert and oriented to person, place, and time.     Coordination: Coordination normal.  Psychiatric:        Behavior: Behavior normal.      Assessment & Plan:   Problem List Items Addressed This Visit       Digestive   GERD (gastroesophageal  reflux disease)     Endocrine   Type 2 diabetes mellitus with other specified complication ([place]) - Primary   Relevant Medications   tirzepatide (MOUNJARO) 7.5 MG/0.5ML Pen   Other Relevant Orders   CBC with Differential/Platelet   CMP14+EGFR   Lipid panel   Bayer DCA Hb A1c Waived   Hyperlipidemia associated with type 2 diabetes mellitus ([place])   Relevant Medications   tirzepatide (MOUNJARO) 7.5 MG/0.5ML Pen   Other Relevant Orders   CBC with Differential/Platelet   CMP14+EGFR   Lipid panel   Bayer DCA Hb A1c Waived    A1c looks good at 6.6, patient is tolerating well, continue current medicine, no changes, will check other blood work. Follow up plan: No follow-ups on file.  Counseling provided for all of the vaccine components Orders Placed This Encounter  Procedures   CBC with Differential/Platelet   CMP14+EGFR   Lipid panel   Bayer DCA Hb A1c Elliott, MD [facility] 02/12/2021, 1:50 PM

## 2021-02-13 LAB — CMP14+EGFR
ALT: 15 IU/L (ref 0–44)
AST: 15 IU/L (ref 0–40)
Albumin/Globulin Ratio: 2.1 (ref 1.2–2.2)
Albumin: 4.7 g/dL (ref 4.0–5.0)
Alkaline Phosphatase: 51 IU/L (ref 44–121)
BUN/Creatinine Ratio: 17 (ref 9–20)
BUN: 20 mg/dL (ref 6–24)
Bilirubin Total: 0.4 mg/dL (ref 0.0–1.2)
CO2: 24 mmol/L (ref 20–29)
Calcium: 10 mg/dL (ref 8.7–10.2)
Chloride: 104 mmol/L (ref 96–106)
Creatinine, Ser: 1.15 mg/dL (ref 0.76–1.27)
Globulin, Total: 2.2 g/dL (ref 1.5–4.5)
Glucose: 94 mg/dL (ref 70–99)
Potassium: 4.4 mmol/L (ref 3.5–5.2)
Sodium: 141 mmol/L (ref 134–144)
Total Protein: 6.9 g/dL (ref 6.0–8.5)
eGFR: 78 mL/min/{1.73_m2} (ref >59)

## 2021-02-13 LAB — LIPID PANEL
Chol/HDL Ratio: 5.6 ratio — ABNORMAL HIGH (ref 0.0–5.0)
Cholesterol, Total: 208 mg/dL — ABNORMAL HIGH (ref 100–199)
HDL: 37 mg/dL — ABNORMAL LOW (ref >39)
LDL Chol Calc (NIH): 133 mg/dL — ABNORMAL HIGH (ref 0–99)
Triglycerides: 214 mg/dL — ABNORMAL HIGH (ref 0–149)
VLDL Cholesterol Cal: 38 mg/dL (ref 5–40)

## 2021-02-13 LAB — CBC WITH DIFFERENTIAL/PLATELET
Basophils Absolute: 0.1 10*3/uL (ref 0.0–0.2)
Basos: 1 %
EOS (ABSOLUTE): 0.2 10*3/uL (ref 0.0–0.4)
Eos: 2 %
Hematocrit: 46.4 % (ref 37.5–51.0)
Hemoglobin: 15.9 g/dL (ref 13.0–17.7)
Immature Grans (Abs): 0 10*3/uL (ref 0.0–0.1)
Immature Granulocytes: 0 %
Lymphocytes Absolute: 2.7 10*3/uL (ref 0.7–3.1)
Lymphs: 37 %
MCH: 30.7 pg (ref 26.6–33.0)
MCHC: 34.3 g/dL (ref 31.5–35.7)
MCV: 90 fL (ref 79–97)
Monocytes Absolute: 0.6 10*3/uL (ref 0.1–0.9)
Monocytes: 8 %
Neutrophils Absolute: 3.9 10*3/uL (ref 1.4–7.0)
Neutrophils: 52 %
Platelets: 209 10*3/uL (ref 150–450)
RBC: 5.18 x10E6/uL (ref 4.14–5.80)
RDW: 12.8 % (ref 11.6–15.4)
WBC: 7.4 10*3/uL (ref 3.4–10.8)

## 2021-02-28 ENCOUNTER — Other Ambulatory Visit: Payer: Self-pay | Admitting: Family Medicine

## 2021-02-28 MED ORDER — PRAVASTATIN SODIUM 40 MG PO TABS: 40.0000 mg | ORAL_TABLET | Freq: Every day | ORAL | 3 refills | Status: DC

## 2021-05-09 ENCOUNTER — Encounter: Payer: Self-pay | Admitting: Family Medicine

## 2021-05-09 ENCOUNTER — Ambulatory Visit: Payer: BC Managed Care – PPO | Admitting: Family Medicine

## 2021-05-09 VITALS — BP 129/85 | HR 93 | Ht 70.0 in | Wt 190.0 lb

## 2021-05-09 DIAGNOSIS — E785 Hyperlipidemia, unspecified: Secondary | ICD-10-CM | POA: Diagnosis not present

## 2021-05-09 DIAGNOSIS — E1169 Type 2 diabetes mellitus with other specified complication: Secondary | ICD-10-CM | POA: Diagnosis not present

## 2021-05-09 LAB — BAYER DCA HB A1C WAIVED: HB A1C (BAYER DCA - WAIVED): 6.4 % — ABNORMAL HIGH (ref 4.8–5.6)

## 2021-05-09 NOTE — Progress Notes (Signed)
? ?BP 129/85   Pulse 93   Ht 5\' 10"  (1.778 m)   Wt 190 lb (86.2 kg)   SpO2 98%   BMI 27.26 kg/m?   ? ?Subjective:  ? ?Patient ID: Craig Allison, male    DOB: Dec 28, 1970, 51 y.o.   MRN: [id number] ? ?HPI: ?Craig Allison is a 51 y.o. male presenting on 05/09/2021 for Medical Management of Chronic Issues, Diabetes, and Hyperlipidemia ? ? ?HPI ?Type 2 diabetes mellitus ?Patient comes in today for recheck of his diabetes. Patient has been currently taking Mounjaro. Patient is not currently on an ACE inhibitor/ARB. Patient has not seen an ophthalmologist this year. Patient denies any issues with their feet. The symptom started onset as an adult hyperlipidemia ARE RELATED TO DM  ? ?Hyperlipidemia ?Patient is coming in for recheck of his hyperlipidemia. The patient is currently taking pravastatin. They deny any issues with myalgias or history of liver damage from it. They deny any focal numbness or weakness or chest pain.  ? ?GERD ?Patient is currently on no medication currently.  She denies any major symptoms or abdominal pain or belching or burping. She denies any blood in her stool or lightheadedness or dizziness.  ? ?Relevant past medical, surgical, family and social history reviewed and updated as indicated. Interim medical history since our last visit reviewed. ?Allergies and medications reviewed and updated. ? ?Review of Systems  ?Constitutional:  Negative for chills and fever.  ?Eyes:  Negative for visual disturbance.  ?Respiratory:  Negative for shortness of breath and wheezing.   ?Cardiovascular:  Negative for chest pain and leg swelling.  ?Musculoskeletal:  Negative for back pain and gait problem.  ?Skin:  Negative for rash.  ?Neurological:  Negative for dizziness, weakness and light-headedness.  ?All other systems reviewed and are negative. ? ?Per HPI unless specifically indicated above ? ? ?Allergies as of 05/09/2021   ?No Known Allergies ?  ? ?  ?Medication List  ?  ? ?  ? Accurate as of May 09, 2021   2:24 PM. If you have any questions, ask your nurse or doctor.  ?  ?  ? ?  ? ?pravastatin 40 MG tablet ?Commonly known as: PRAVACHOL ?Take 1 tablet (40 mg total) by mouth daily. ?  ?tirzepatide 7.5 MG/0.5ML Pen ?Commonly known as: MOUNJARO ?Inject 7.5 mg into the skin once a week. ?  ? ?  ? ? ? ?Objective:  ? ?BP 129/85   Pulse 93   Ht 5\' 10"  (1.778 m)   Wt 190 lb (86.2 kg)   SpO2 98%   BMI 27.26 kg/m?   ?Wt Readings from Last 3 Encounters:  ?05/09/21 190 lb (86.2 kg)  ?02/12/21 184 lb (83.5 kg)  ?01/24/20 202 lb (91.6 kg)  ?  ?Physical Exam ?Vitals and nursing note reviewed.  ?Constitutional:   ?   General: He is not in acute distress. ?   Appearance: He is well-developed. He is not diaphoretic.  ?Eyes:  ?   General: No scleral icterus. ?   Conjunctiva/sclera: Conjunctivae normal.  ?Neck:  ?   Thyroid: No thyromegaly.  ?Cardiovascular:  ?   Rate and Rhythm: Normal rate and regular rhythm.  ?   Heart sounds: Normal heart sounds. No murmur heard. ?Pulmonary:  ?   Effort: Pulmonary effort is normal. No respiratory distress.  ?   Breath sounds: Normal breath sounds. No wheezing.  ?Musculoskeletal:     ?   General: No swelling. Normal range of motion.  ?  Cervical back: Neck supple.  ?Lymphadenopathy:  ?   Cervical: No cervical adenopathy.  ?Skin: ?   General: Skin is warm and dry.  ?   Findings: No rash.  ?Neurological:  ?   Mental Status: He is alert and oriented to person, place, and time.  ?   Coordination: Coordination normal.  ?Psychiatric:     ?   Behavior: Behavior normal.  ? ? ? ? ?Assessment & Plan:  ? ?Problem List Items Addressed This Visit   ? ?  ? Endocrine  ? Type 2 diabetes mellitus with other specified complication ([place]) - Primary  ? Relevant Orders  ? Bayer DCA Hb A1c Waived  ? Hyperlipidemia associated with type 2 diabetes mellitus ([place])  ?  ?A1c looks good at 6.4, no changes. ?Follow up plan: ?Return in about 3 months (around 08/08/2021), or if symptoms worsen or fail to improve, for Diabetes and  cholesterol. ? ?Counseling provided for all of the vaccine components ?Orders Placed This Encounter  ?Procedures  ? Bayer DCA Hb A1c Waived  ? ? ?[name], MD ?[place] ?05/09/2021, 2:24 PM ? ? ? ? ?

## 2021-05-26 ENCOUNTER — Telehealth: Payer: Self-pay | Admitting: Family Medicine

## 2021-05-26 NOTE — Telephone Encounter (Signed)
Pt called stating that he cant get a refill on his Craig Allison Rx [facility] in [place] because they are out of stock and dont know when they will get more. ? ?Needs advise.  ?

## 2021-05-26 NOTE — Telephone Encounter (Signed)
Called to let patient know to check with other pharmacy to see if they have and call us back to let us know had to leave message  ?

## 2021-05-27 ENCOUNTER — Telehealth: Payer: Self-pay | Admitting: Family Medicine

## 2021-05-27 ENCOUNTER — Other Ambulatory Visit: Payer: Self-pay

## 2021-05-27 DIAGNOSIS — E1169 Type 2 diabetes mellitus with other specified complication: Secondary | ICD-10-CM

## 2021-05-27 MED ORDER — TIRZEPATIDE 7.5 MG/0.5ML ~~LOC~~ SOAJ: 7.5000 mg | SUBCUTANEOUS | 3 refills | Status: DC

## 2021-05-27 NOTE — Telephone Encounter (Signed)
Patient calls back and states he never talked to anyone yesterday. Told patient to call other pharmacies to see if he could find anyone that had the [facility] and call us back to let us know. ?

## 2021-05-27 NOTE — Telephone Encounter (Signed)
[facility] calls back and [facility]. ?

## 2021-05-27 NOTE — Telephone Encounter (Signed)
Pt has been informed that Craig [facility] has [facility] [place]. Advised that a PA might be required. He states that he has never had a problem and that he only pays $25 for it.  Pt will call back if needed. ?

## 2021-05-28 NOTE — Telephone Encounter (Signed)
Your request has been approved ?PA Case: [id number], Status: Approved, Coverage Starts on: 05/28/2021 12:00:00 AM, Coverage Ends on: 05/28/2022 12:00:00 AM. ? ?Pharmacy aware ?

## 2021-05-28 NOTE — Telephone Encounter (Signed)
Pharmacy comment: Alternative Requested:PRIOR AUTHORIZATION IS NEEDED FOR INSURANCE TO COVER.  ?All Pharmacy Suggested Alternatives:  ?0 liraglutide (VICTOZA) 18 MG/3ML SOPN  ?0 Dulaglutide (TRULICITY) 0.75 MG/0.5ML SOPN  ?0 Semaglutide,0.25 or 0.5MG /DOS, (OZEMPIC, 0.25 OR 0.5 MG/DOSE,) 2 MG/3ML SOPN   ? ? ? ? ? ? ? ? ? ? ? ?Daruis Gaw (Key: [id number]) ?Mounjaro 7.5MG /0.5ML pen-injectors ?  ?Form ?[employer] PA Form [phone] NCPDP) ? ?Wait for Determination ?Please wait for [employer] to return a determination. Sent to plan  ?

## 2021-08-11 ENCOUNTER — Encounter: Payer: Self-pay | Admitting: Family Medicine

## 2021-08-11 ENCOUNTER — Ambulatory Visit: Payer: BC Managed Care – PPO | Admitting: Family Medicine

## 2021-08-11 VITALS — BP 130/82 | HR 101 | Ht 70.0 in | Wt 184.0 lb

## 2021-08-11 DIAGNOSIS — E1169 Type 2 diabetes mellitus with other specified complication: Secondary | ICD-10-CM

## 2021-08-11 DIAGNOSIS — E785 Hyperlipidemia, unspecified: Secondary | ICD-10-CM

## 2021-08-11 LAB — BAYER DCA HB A1C WAIVED: HB A1C (BAYER DCA - WAIVED): 6.4 % — ABNORMAL HIGH (ref 4.8–5.6)

## 2021-08-11 NOTE — Progress Notes (Signed)
BP 130/82   Pulse (!) 101   Ht _0  (1.778 m)   Wt 184 lb (83.5 kg)   SpO2 99%   BMI 26.40 kg/m    Subjective:   Patient ID: Craig Allison, male    DOB: 06/25/1970, 51 y.o.   MRN: [id number]  HPI: Craig Allison is a 51 y.o. male presenting on 08/11/2021 for Medical Management of Chronic Issues, Hyperlipidemia, and Diabetes   HPI Type 2 diabetes mellitus Patient comes in today for recheck of his diabetes. Patient has been currently taking mounjaro. Patient is not currently on an ACE inhibitor/ARB. Patient has not seen an ophthalmologist this year. Patient denies any issues with their feet. The symptom started onset as an adult hyperlipidemia ARE RELATED TO DM   Hyperlipidemia Patient is coming in for recheck of his hyperlipidemia. The patient is currently taking pravastatin although admits he was not taking it for a while until about 2 weeks ago he started taking it again.. They deny any issues with myalgias or history of liver damage from it. They deny any focal numbness or weakness or chest pain.   Relevant past medical, surgical, family and social history reviewed and updated as indicated. Interim medical history since our last visit reviewed. Allergies and medications reviewed and updated.  Review of Systems  Constitutional:  Negative for chills and fever.  Eyes:  Negative for visual disturbance.  Respiratory:  Negative for shortness of breath and wheezing.   Cardiovascular:  Negative for chest pain and leg swelling.  Musculoskeletal:  Negative for back pain and gait problem.  Skin:  Negative for rash.  Neurological:  Negative for dizziness, weakness and light-headedness.  All other systems reviewed and are negative.   Per HPI unless specifically indicated above   Allergies as of 08/11/2021   No Known Allergies      Medication List        Accurate as of August 11, 2021  4:10 PM. If you have any questions, ask your nurse or doctor.          pravastatin 40 MG  tablet Commonly known as: PRAVACHOL Take 1 tablet (40 mg total) by mouth daily.   tirzepatide 7.5 MG/0.5ML Pen Commonly known as: MOUNJARO Inject 7.5 mg into the skin once a week.         Objective:   BP 130/82   Pulse (!) 101   Ht _1  (1.778 m)   Wt 184 lb (83.5 kg)   SpO2 99%   BMI 26.40 kg/m   Wt Readings from Last 3 Encounters:  08/11/21 184 lb (83.5 kg)  05/09/21 190 lb (86.2 kg)  02/12/21 184 lb (83.5 kg)    Physical Exam Vitals and nursing note reviewed.  Constitutional:      General: He is not in acute distress.    Appearance: He is well-developed. He is not diaphoretic.  Eyes:     General: No scleral icterus.    Conjunctiva/sclera: Conjunctivae normal.  Neck:     Thyroid: No thyromegaly.  Cardiovascular:     Rate and Rhythm: Normal rate and regular rhythm.     Heart sounds: Normal heart sounds. No murmur heard. Pulmonary:     Effort: Pulmonary effort is normal. No respiratory distress.     Breath sounds: Normal breath sounds. No wheezing.  Musculoskeletal:        General: No swelling. Normal range of motion.     Cervical back: Neck supple.  Lymphadenopathy:  Cervical: No cervical adenopathy.  Skin:    General: Skin is warm and dry.     Findings: No rash.  Neurological:     Mental Status: He is alert and oriented to person, place, and time.     Coordination: Coordination normal.  Psychiatric:        Behavior: Behavior normal.       Assessment & Plan:   Problem List Items Addressed This Visit       Endocrine   Type 2 diabetes mellitus with other specified complication ([place]) - Primary   Relevant Orders   CBC with Differential/Platelet   CMP14+EGFR   Lipid panel   Bayer DCA Hb A1c Waived   Hyperlipidemia associated with type 2 diabetes mellitus ([place])   Relevant Orders   CBC with Differential/Platelet   CMP14+EGFR   Lipid panel   Bayer DCA Hb A1c Waived    A1c same at 6.4, continue current medicine.  No changes. Follow up  plan: Return in about 3 months (around 11/11/2021), or if symptoms worsen or fail to improve, for dm and hld.  Counseling provided for all of the vaccine components Orders Placed This Encounter  Procedures   CBC with Differential/Platelet   CMP14+EGFR   Lipid panel   Bayer DCA Hb A1c Waived    [name], MD [facility] 08/11/2021, 4:10 PM

## 2021-08-12 LAB — CMP14+EGFR
ALT: 18 IU/L (ref 0–44)
AST: 16 IU/L (ref 0–40)
Albumin/Globulin Ratio: 1.8 (ref 1.2–2.2)
Albumin: 4.6 g/dL (ref 3.8–4.9)
Alkaline Phosphatase: 46 IU/L (ref 44–121)
BUN/Creatinine Ratio: 15 (ref 9–20)
BUN: 18 mg/dL (ref 6–24)
Bilirubin Total: 0.3 mg/dL (ref 0.0–1.2)
CO2: 23 mmol/L (ref 20–29)
Calcium: 10.4 mg/dL — ABNORMAL HIGH (ref 8.7–10.2)
Chloride: 103 mmol/L (ref 96–106)
Creatinine, Ser: 1.21 mg/dL (ref 0.76–1.27)
Globulin, Total: 2.5 g/dL (ref 1.5–4.5)
Glucose: 158 mg/dL — ABNORMAL HIGH (ref 70–99)
Potassium: 4.5 mmol/L (ref 3.5–5.2)
Sodium: 140 mmol/L (ref 134–144)
Total Protein: 7.1 g/dL (ref 6.0–8.5)
eGFR: 72 mL/min/{1.73_m2} (ref >59)

## 2021-08-12 LAB — CBC WITH DIFFERENTIAL/PLATELET
Basophils Absolute: 0.1 10*3/uL (ref 0.0–0.2)
Basos: 1 %
EOS (ABSOLUTE): 0.2 10*3/uL (ref 0.0–0.4)
Eos: 3 %
Hematocrit: 45 % (ref 37.5–51.0)
Hemoglobin: 15.3 g/dL (ref 13.0–17.7)
Immature Grans (Abs): 0 10*3/uL (ref 0.0–0.1)
Immature Granulocytes: 0 %
Lymphocytes Absolute: 2.7 10*3/uL (ref 0.7–3.1)
Lymphs: 42 %
MCH: 30.5 pg (ref 26.6–33.0)
MCHC: 34 g/dL (ref 31.5–35.7)
MCV: 90 fL (ref 79–97)
Monocytes Absolute: 0.4 10*3/uL (ref 0.1–0.9)
Monocytes: 6 %
Neutrophils Absolute: 3 10*3/uL (ref 1.4–7.0)
Neutrophils: 48 %
Platelets: 176 10*3/uL (ref 150–450)
RBC: 5.02 x10E6/uL (ref 4.14–5.80)
RDW: 12.6 % (ref 11.6–15.4)
WBC: 6.4 10*3/uL (ref 3.4–10.8)

## 2021-08-12 LAB — LIPID PANEL
Chol/HDL Ratio: 5.8 ratio — ABNORMAL HIGH (ref 0.0–5.0)
Cholesterol, Total: 198 mg/dL (ref 100–199)
HDL: 34 mg/dL — ABNORMAL LOW (ref >39)
LDL Chol Calc (NIH): 135 mg/dL — ABNORMAL HIGH (ref 0–99)
Triglycerides: 162 mg/dL — ABNORMAL HIGH (ref 0–149)
VLDL Cholesterol Cal: 29 mg/dL (ref 5–40)

## 2021-08-22 MED ORDER — PRAVASTATIN SODIUM 80 MG PO TABS: 80.0000 mg | ORAL_TABLET | Freq: Every day | ORAL | 1 refills | Status: DC

## 2021-08-22 NOTE — Addendum Note (Signed)
Addended by: Dorene Sorrow on: 08/22/2021 11:12 AM   Modules accepted: Orders

## 2021-09-11 ENCOUNTER — Other Ambulatory Visit: Payer: Self-pay | Admitting: Family Medicine

## 2021-09-11 DIAGNOSIS — E1169 Type 2 diabetes mellitus with other specified complication: Secondary | ICD-10-CM

## 2021-11-14 ENCOUNTER — Encounter: Payer: Self-pay | Admitting: Family Medicine

## 2021-11-14 ENCOUNTER — Ambulatory Visit: Payer: BC Managed Care – PPO | Admitting: Family Medicine

## 2021-11-14 VITALS — BP 131/74 | HR 92 | Temp 98.0°F | Ht 70.0 in | Wt 184.0 lb

## 2021-11-14 DIAGNOSIS — K219 Gastro-esophageal reflux disease without esophagitis: Secondary | ICD-10-CM | POA: Diagnosis not present

## 2021-11-14 DIAGNOSIS — E1169 Type 2 diabetes mellitus with other specified complication: Secondary | ICD-10-CM

## 2021-11-14 DIAGNOSIS — E785 Hyperlipidemia, unspecified: Secondary | ICD-10-CM

## 2021-11-14 LAB — BAYER DCA HB A1C WAIVED: HB A1C (BAYER DCA - WAIVED): 6.6 % — ABNORMAL HIGH (ref 4.8–5.6)

## 2021-11-14 MED ORDER — LORATADINE 10 MG PO TABS: 10.0000 mg | ORAL_TABLET | Freq: Every day | ORAL | 3 refills | Status: DC

## 2021-11-14 MED ORDER — MOUNJARO 7.5 MG/0.5ML ~~LOC~~ SOAJ: 7.5000 mg | SUBCUTANEOUS | 1 refills | Status: DC

## 2021-11-14 MED ORDER — PRAVASTATIN SODIUM 80 MG PO TABS: 80.0000 mg | ORAL_TABLET | Freq: Every day | ORAL | 1 refills | Status: DC

## 2021-11-14 NOTE — Progress Notes (Signed)
BP 131/74   Pulse 92   Temp 98 F (36.7 C)   Ht 5\' 10"  (1.778 m)   Wt 184 lb (83.5 kg)   SpO2 98%   BMI 26.40 kg/m    Subjective:   Patient ID: Craig Allison, male    DOB: 11-19-1970, 51 y.o.   MRN: [id number]  HPI: Craig Allison is a 51 y.o. male presenting on 11/14/2021 for Medical Management of Chronic Issues, Diabetes, and Hyperlipidemia   HPI Type 2 diabetes mellitus Patient comes in today for recheck of his diabetes. Patient has been currently taking Mounjaro. Patient is not currently on an ACE inhibitor/ARB. Patient has not seen an ophthalmologist this year. Patient denies any issues with their feet. The symptom started onset as an adult hyperlipidemia ARE RELATED TO DM   Hyperlipidemia Patient is coming in for recheck of his hyperlipidemia. The patient is currently taking no medication, did not start Pravachol yet, did not pick it up.. They deny any issues with myalgias or history of liver damage from it. They deny any focal numbness or weakness or chest pain.   GERD Patient is currently on no medication, managing with diet.  She denies any major symptoms or abdominal pain or belching or burping. She denies any blood in her stool or lightheadedness or dizziness.   Relevant past medical, surgical, family and social history reviewed and updated as indicated. Interim medical history since our last visit reviewed. Allergies and medications reviewed and updated.  Review of Systems  Constitutional:  Negative for chills and fever.  Eyes:  Negative for visual disturbance.  Respiratory:  Negative for shortness of breath and wheezing.   Cardiovascular:  Negative for chest pain and leg swelling.  Musculoskeletal:  Negative for back pain and gait problem.  Skin:  Negative for rash.  Neurological:  Negative for dizziness, weakness and light-headedness.  All other systems reviewed and are negative.   Per HPI unless specifically indicated above   Allergies as of 11/14/2021    No Known Allergies      Medication List        Accurate as of November 14, 2021  2:57 PM. If you have any questions, ask your nurse or doctor.          Mounjaro 7.5 MG/0.5ML Pen Generic drug: tirzepatide Inject 7.5 mg into the skin once a week. What changed: See the new instructions. Changed by: Fransisca Kaufmann Terasa Orsini, MD   pravastatin 80 MG tablet Commonly known as: PRAVACHOL Take 1 tablet (80 mg total) by mouth daily.         Objective:   BP 131/74   Pulse 92   Temp 98 F (36.7 C)   Ht 5\' 10"  (1.778 m)   Wt 184 lb (83.5 kg)   SpO2 98%   BMI 26.40 kg/m   Wt Readings from Last 3 Encounters:  11/14/21 184 lb (83.5 kg)  08/11/21 184 lb (83.5 kg)  05/09/21 190 lb (86.2 kg)    Physical Exam Vitals and nursing note reviewed.  Constitutional:      General: He is not in acute distress.    Appearance: He is well-developed. He is not diaphoretic.  Eyes:     General: No scleral icterus.    Conjunctiva/sclera: Conjunctivae normal.  Neck:     Thyroid: No thyromegaly.  Cardiovascular:     Rate and Rhythm: Normal rate and regular rhythm.     Heart sounds: Normal heart sounds. No murmur heard. Pulmonary:  Effort: Pulmonary effort is normal. No respiratory distress.     Breath sounds: Normal breath sounds. No wheezing.  Musculoskeletal:        General: No swelling. Normal range of motion.     Cervical back: Neck supple.  Lymphadenopathy:     Cervical: No cervical adenopathy.  Skin:    General: Skin is warm and dry.     Findings: No rash.  Neurological:     Mental Status: He is alert and oriented to person, place, and time.     Coordination: Coordination normal.  Psychiatric:        Behavior: Behavior normal.       Assessment & Plan:   Problem List Items Addressed This Visit       Digestive   GERD (gastroesophageal reflux disease)     Endocrine   Type 2 diabetes mellitus with other specified complication (HCC) - Primary   Relevant Medications    tirzepatide (MOUNJARO) 7.5 MG/0.5ML Pen   pravastatin (PRAVACHOL) 80 MG tablet   Other Relevant Orders   Bayer DCA Hb A1c Waived   Hyperlipidemia associated with type 2 diabetes mellitus (HCC)   Relevant Medications   tirzepatide (MOUNJARO) 7.5 MG/0.5ML Pen   pravastatin (PRAVACHOL) 80 MG tablet    A1c is 6.6, looks good, no changes. Follow up plan: Return in about 3 months (around 02/14/2022), or if symptoms worsen or fail to improve, for Diabetes hyperlipidemia GERD.  Counseling provided for all of the vaccine components Orders Placed This Encounter  Procedures   Bayer [place] Hb A1c [place] Cosima Prentiss, MD [facility] 11/14/2021, 2:57 PM

## 2021-11-14 NOTE — Addendum Note (Signed)
Addended by: Alphonzo Dublin on: 11/14/2021 03:28 PM   Modules accepted: Orders

## 2021-11-14 NOTE — Addendum Note (Signed)
Addended by: [name] on: 11/14/2021 03:17 PM   Modules accepted: Orders

## 2021-11-15 LAB — MICROALBUMIN / CREATININE URINE RATIO
Creatinine, Urine: 40.2 mg/dL
Microalb/Creat Ratio: <7 mg/g creat (ref 0–29)
Microalbumin, Urine: <3 ug/mL

## 2021-12-04 ENCOUNTER — Other Ambulatory Visit: Payer: Self-pay | Admitting: Family Medicine

## 2021-12-04 DIAGNOSIS — E1169 Type 2 diabetes mellitus with other specified complication: Secondary | ICD-10-CM

## 2022-02-13 ENCOUNTER — Ambulatory Visit: Payer: BC Managed Care – PPO | Admitting: Family Medicine

## 2022-02-13 ENCOUNTER — Encounter: Payer: Self-pay | Admitting: Family Medicine

## 2022-02-13 VITALS — BP 128/75 | HR 97 | Temp 97.6°F | Ht 70.0 in | Wt 183.0 lb

## 2022-02-13 DIAGNOSIS — E785 Hyperlipidemia, unspecified: Secondary | ICD-10-CM

## 2022-02-13 DIAGNOSIS — E1169 Type 2 diabetes mellitus with other specified complication: Secondary | ICD-10-CM

## 2022-02-13 LAB — BAYER DCA HB A1C WAIVED: HB A1C (BAYER DCA - WAIVED): 7.3 % — ABNORMAL HIGH (ref 4.8–5.6)

## 2022-02-13 MED ORDER — TIRZEPATIDE 10 MG/0.5ML ~~LOC~~ SOAJ: 10.0000 mg | SUBCUTANEOUS | 3 refills | Status: DC

## 2022-02-13 MED ORDER — MOUNJARO 7.5 MG/0.5ML ~~LOC~~ SOAJ: 7.5000 mg | SUBCUTANEOUS | 3 refills | Status: DC

## 2022-02-13 MED ORDER — ROSUVASTATIN CALCIUM 10 MG PO TABS: 10.0000 mg | ORAL_TABLET | Freq: Every day | ORAL | 3 refills | Status: DC

## 2022-02-13 NOTE — Addendum Note (Signed)
Addended by: [name] on: 02/13/2022 02:45 PM   Modules accepted: Orders

## 2022-02-13 NOTE — Progress Notes (Signed)
BP 128/75   Pulse 97   Temp 97.6 F (36.4 C)   Ht 5\' 10"  (1.778 m)   Wt 183 lb (83 kg)   SpO2 98%   BMI 26.26 kg/m    Subjective:   Patient ID: Craig Allison, male    DOB: [birth year], 52 y.o.   MRN: [id number]  HPI: Craig Allison is a 52 y.o. male presenting on 02/13/2022 for Medical Management of Chronic Issues, Diabetes, and Hyperlipidemia   HPI Type 2 diabetes mellitus Patient comes in today for recheck of his diabetes. Patient has been currently taking Mounjaro. Patient is not currently on an ACE inhibitor/ARB. Patient has not seen an ophthalmologist this year. Patient denies any issues with their feet. The symptom started onset as an adult hyperlipidemia ARE RELATED TO DM   Hyperlipidemia Patient is coming in for recheck of his hyperlipidemia. The patient is currently taking no medicine, stopped pravastatin because of constipation. They deny any issues with myalgias or history of liver damage from it. They deny any focal numbness or weakness or chest pain.   Relevant past medical, surgical, family and social history reviewed and updated as indicated. Interim medical history since our last visit reviewed. Allergies and medications reviewed and updated.  Review of Systems  Constitutional:  Negative for chills and fever.  Eyes:  Negative for visual disturbance.  Respiratory:  Negative for shortness of breath and wheezing.   Cardiovascular:  Negative for chest pain and leg swelling.  Musculoskeletal:  Negative for back pain and gait problem.  Skin:  Negative for rash.  Neurological:  Negative for dizziness, weakness and light-headedness.  All other systems reviewed and are negative.   Per HPI unless specifically indicated above   Allergies as of 02/13/2022   No Known Allergies      Medication List        Accurate as of February 13, 2022  2:41 PM. If you have any questions, ask your nurse or doctor.          STOP taking these medications    loratadine 10 MG  tablet Commonly known as: CLARITIN Stopped by: Worthy Rancher, MD   pravastatin 80 MG tablet Commonly known as: PRAVACHOL Stopped by: Fransisca Kaufmann Mahala Rommel, MD       TAKE these medications    Mounjaro 7.5 MG/0.5ML Pen Generic drug: tirzepatide Inject 7.5 mg into the skin once a week. What changed: See the new instructions. Changed by: Fransisca Kaufmann Mykenzi Vanzile, MD   rosuvastatin 10 MG tablet Commonly known as: Crestor Take 1 tablet (10 mg total) by mouth at bedtime. Started by: Fransisca Kaufmann Jinx Gilden, MD         Objective:   BP 128/75   Pulse 97   Temp 97.6 F (36.4 C)   Ht 5\' 10"  (1.778 m)   Wt 183 lb (83 kg)   SpO2 98%   BMI 26.26 kg/m   Wt Readings from Last 3 Encounters:  02/13/22 183 lb (83 kg)  11/14/21 184 lb (83.5 kg)  08/11/21 184 lb (83.5 kg)    Physical Exam Vitals and nursing note reviewed.  Constitutional:      General: He is not in acute distress.    Appearance: He is well-developed. He is not diaphoretic.  Eyes:     General: No scleral icterus.    Conjunctiva/sclera: Conjunctivae normal.  Neck:     Thyroid: No thyromegaly.  Cardiovascular:     Rate and Rhythm: Normal rate and regular rhythm.  Heart sounds: Normal heart sounds. No murmur heard. Pulmonary:     Effort: Pulmonary effort is normal. No respiratory distress.     Breath sounds: Normal breath sounds. No wheezing.  Musculoskeletal:        General: No swelling. Normal range of motion.     Cervical back: Neck supple.  Lymphadenopathy:     Cervical: No cervical adenopathy.  Skin:    General: Skin is warm and dry.     Findings: No rash.  Neurological:     Mental Status: He is alert and oriented to person, place, and time.     Coordination: Coordination normal.  Psychiatric:        Behavior: Behavior normal.       Assessment & Plan:   Problem List Items Addressed This Visit       Endocrine   Type 2 diabetes mellitus with other specified complication ([place]) - Primary    Relevant Medications   tirzepatide (MOUNJARO) 7.5 MG/0.5ML Pen   rosuvastatin (CRESTOR) 10 MG tablet   Other Relevant Orders   CBC with Differential/Platelet   CMP14+EGFR   Lipid panel   Bayer DCA Hb A1c Waived   Hyperlipidemia associated with type 2 diabetes mellitus (HCC)   Relevant Medications   tirzepatide (MOUNJARO) 7.5 MG/0.5ML Pen   rosuvastatin (CRESTOR) 10 MG tablet   Other Relevant Orders   CBC with Differential/Platelet   CMP14+EGFR   Lipid panel   Bayer DCA Hb A1c Waived    The 10-year ASCVD risk score (Arnett DK, et al., 2019) is: 21.5%   Values used to calculate the score:     Age: 72 years     Sex: Male     Is Non-Hispanic African American: No     Diabetic: Yes     Tobacco smoker: Yes     Systolic Blood Pressure: 161 mmHg     Is BP treated: No     HDL Cholesterol: 34 mg/dL     Total Cholesterol: 198 mg/dL   Patient says pravastatin caused constipation.  Will try him on Crestor. A1c up a little bit at 7.3, likely because of holidays.  Mainly focus on diet.  Will not adjust levels at this point but will adjust in the future if he continues to remain elevated.  Follow up plan: Return in about 3 months (around 05/15/2022), or if symptoms worsen or fail to improve, for Diabetes and cholesterol.  Counseling provided for all of the vaccine components Orders Placed This Encounter  Procedures   CBC with Differential/Platelet   CMP14+EGFR   Lipid panel   Bayer DCA Hb A1c Waived    [name], MD [facility] 02/13/2022, 2:41 PM

## 2022-02-13 NOTE — Addendum Note (Signed)
Addended by: [name] on: 02/13/2022 02:47 PM   Modules accepted: Orders

## 2022-02-14 LAB — CBC WITH DIFFERENTIAL/PLATELET
Basophils Absolute: 0.1 10*3/uL (ref 0.0–0.2)
Basos: 1 %
EOS (ABSOLUTE): 0.2 10*3/uL (ref 0.0–0.4)
Eos: 3 %
Hematocrit: 44.2 % (ref 37.5–51.0)
Hemoglobin: 15.1 g/dL (ref 13.0–17.7)
Immature Grans (Abs): 0 10*3/uL (ref 0.0–0.1)
Immature Granulocytes: 0 %
Lymphocytes Absolute: 2.3 10*3/uL (ref 0.7–3.1)
Lymphs: 38 %
MCH: 30.2 pg (ref 26.6–33.0)
MCHC: 34.2 g/dL (ref 31.5–35.7)
MCV: 88 fL (ref 79–97)
Monocytes Absolute: 0.4 10*3/uL (ref 0.1–0.9)
Monocytes: 7 %
Neutrophils Absolute: 3 10*3/uL (ref 1.4–7.0)
Neutrophils: 51 %
Platelets: 192 10*3/uL (ref 150–450)
RBC: 5 x10E6/uL (ref 4.14–5.80)
RDW: 11.9 % (ref 11.6–15.4)
WBC: 5.9 10*3/uL (ref 3.4–10.8)

## 2022-02-14 LAB — CMP14+EGFR
ALT: 20 IU/L (ref 0–44)
AST: 17 IU/L (ref 0–40)
Albumin/Globulin Ratio: 1.8 (ref 1.2–2.2)
Albumin: 4.4 g/dL (ref 3.8–4.9)
Alkaline Phosphatase: 47 IU/L (ref 44–121)
BUN/Creatinine Ratio: 14 (ref 9–20)
BUN: 14 mg/dL (ref 6–24)
Bilirubin Total: 0.3 mg/dL (ref 0.0–1.2)
CO2: 23 mmol/L (ref 20–29)
Calcium: 10.2 mg/dL (ref 8.7–10.2)
Chloride: 101 mmol/L (ref 96–106)
Creatinine, Ser: 0.98 mg/dL (ref 0.76–1.27)
Globulin, Total: 2.4 g/dL (ref 1.5–4.5)
Glucose: 174 mg/dL — ABNORMAL HIGH (ref 70–99)
Potassium: 4.1 mmol/L (ref 3.5–5.2)
Sodium: 137 mmol/L (ref 134–144)
Total Protein: 6.8 g/dL (ref 6.0–8.5)
eGFR: 93 mL/min/{1.73_m2} (ref >59)

## 2022-02-14 LAB — LIPID PANEL
Chol/HDL Ratio: 5.2 ratio — ABNORMAL HIGH (ref 0.0–5.0)
Cholesterol, Total: 199 mg/dL (ref 100–199)
HDL: 38 mg/dL — ABNORMAL LOW (ref >39)
LDL Chol Calc (NIH): 122 mg/dL — ABNORMAL HIGH (ref 0–99)
Triglycerides: 222 mg/dL — ABNORMAL HIGH (ref 0–149)
VLDL Cholesterol Cal: 39 mg/dL (ref 5–40)

## 2022-05-18 ENCOUNTER — Telehealth: Payer: Self-pay

## 2022-05-18 NOTE — Telephone Encounter (Signed)
Lena Ethington (Key: [id number]) Need Help? Call us at [phone] Status sent iconSent to Plan today Drug Mounjaro 7.5MG /0.5ML pen-injectors ePA cloud Statistician PA Form [phone] NCPDP)

## 2022-05-18 NOTE — Telephone Encounter (Signed)
Outcome Additional Information Required Available without authorization. Drug Mounjaro 7.5MG /0.5ML pen-injectors ePA cloud Statistician PA Form [phone] NCPDP)  Pharmacy informed

## 2022-05-20 ENCOUNTER — Encounter: Payer: Self-pay | Admitting: Family Medicine

## 2022-05-20 ENCOUNTER — Ambulatory Visit: Payer: BC Managed Care – PPO | Admitting: Family Medicine

## 2022-05-20 VITALS — BP 136/83 | HR 98 | Ht 70.0 in | Wt 183.0 lb

## 2022-05-20 DIAGNOSIS — Z1211 Encounter for screening for malignant neoplasm of colon: Secondary | ICD-10-CM

## 2022-05-20 DIAGNOSIS — Z7985 Long-term (current) use of injectable non-insulin antidiabetic drugs: Secondary | ICD-10-CM

## 2022-05-20 DIAGNOSIS — K219 Gastro-esophageal reflux disease without esophagitis: Secondary | ICD-10-CM | POA: Diagnosis not present

## 2022-05-20 DIAGNOSIS — E785 Hyperlipidemia, unspecified: Secondary | ICD-10-CM | POA: Diagnosis not present

## 2022-05-20 DIAGNOSIS — E1169 Type 2 diabetes mellitus with other specified complication: Secondary | ICD-10-CM

## 2022-05-20 LAB — BAYER DCA HB A1C WAIVED: HB A1C (BAYER DCA - WAIVED): 6.5 % — ABNORMAL HIGH (ref 4.8–5.6)

## 2022-05-20 NOTE — Progress Notes (Signed)
BP 136/83   Pulse 98   Ht 5\' 10"  (1.778 m)   Wt 183 lb (83 kg)   SpO2 99%   BMI 26.26 kg/m    Subjective:   Patient ID: Craig Allison, male    DOB: 06/04/1970, 52 y.o.   MRN: [id number]  HPI: Craig Allison is a 52 y.o. male presenting on 05/20/2022 for Medical Management of Chronic Issues, Hyperlipidemia, and Diabetes   HPI Type 2 diabetes mellitus Patient comes in today for recheck of his diabetes. Patient has been currently taking Mounjaro. Patient is not currently on an ACE inhibitor/ARB. Patient has not seen an ophthalmologist this year. Patient denies any new issues with their feet. The symptom started onset as an adult hyperlipidemia ARE RELATED TO DM   Hyperlipidemia Patient is coming in for recheck of his hyperlipidemia. The patient is currently taking Crestor. They deny any issues with myalgias or history of liver damage from it. They deny any focal numbness or weakness or chest pain.   GERD Patient is currently on no medication.  he denies any major symptoms or abdominal pain or belching or burping. he denies any blood in her stool or lightheadedness or dizziness.   Relevant past medical, surgical, family and social history reviewed and updated as indicated. Interim medical history since our last visit reviewed. Allergies and medications reviewed and updated.  Review of Systems  Constitutional:  Negative for chills and fever.  Eyes:  Negative for visual disturbance.  Respiratory:  Negative for shortness of breath and wheezing.   Cardiovascular:  Negative for chest pain and leg swelling.  Musculoskeletal:  Negative for back pain and gait problem.  Skin:  Negative for rash.  All other systems reviewed and are negative.   Per HPI unless specifically indicated above   Allergies as of 05/20/2022   No Known Allergies      Medication List        Accurate as of May 20, 2022  2:35 PM. If you have any questions, ask your nurse or doctor.           rosuvastatin 10 MG tablet Commonly known as: Crestor Take 1 tablet (10 mg total) by mouth at bedtime.   tirzepatide 10 MG/0.5ML Pen Commonly known as: MOUNJARO Inject 10 mg into the skin once a week.         Objective:   BP 136/83   Pulse 98   Ht 5\' 10"  (1.778 m)   Wt 183 lb (83 kg)   SpO2 99%   BMI 26.26 kg/m   Wt Readings from Last 3 Encounters:  05/20/22 183 lb (83 kg)  02/13/22 183 lb (83 kg)  11/14/21 184 lb (83.5 kg)    Physical Exam Vitals and nursing note reviewed.  Constitutional:      General: He is not in acute distress.    Appearance: He is well-developed. He is not diaphoretic.  Eyes:     General: No scleral icterus.    Conjunctiva/sclera: Conjunctivae normal.  Neck:     Thyroid: No thyromegaly.  Cardiovascular:     Rate and Rhythm: Normal rate and regular rhythm.     Heart sounds: Normal heart sounds. No murmur heard. Pulmonary:     Effort: Pulmonary effort is normal. No respiratory distress.     Breath sounds: Normal breath sounds. No wheezing.  Musculoskeletal:        General: No swelling. Normal range of motion.     Cervical back: Neck supple.  Lymphadenopathy:     Cervical: No cervical adenopathy.  Skin:    General: Skin is warm and dry.     Findings: No rash.  Neurological:     Mental Status: He is alert and oriented to person, place, and time.     Coordination: Coordination normal.  Psychiatric:        Behavior: Behavior normal.       Assessment & Plan:   Problem List Items Addressed This Visit       Digestive   GERD (gastroesophageal reflux disease)     Endocrine   Type 2 diabetes mellitus with other specified complication - Primary   Relevant Orders   Bayer DCA Hb A1c Waived   Hyperlipidemia associated with type 2 diabetes mellitus   Other Visit Diagnoses     Colon cancer screening       Relevant Orders   Cologuard       A1c looks much better at 6.5.  Continue current medicine, no changes. Follow up  plan: Return in about 3 months (around 08/19/2022), or if symptoms worsen or fail to improve, for Diabetes and hyperlipidemia and GERD.  Counseling provided for all of the vaccine components Orders Placed This Encounter  Procedures   Bayer [facility] Hb A1c Waived   Cologuard    Arville Care, MD [facility] 05/20/2022, 2:35 PM

## 2022-05-25 DIAGNOSIS — Z1211 Encounter for screening for malignant neoplasm of colon: Secondary | ICD-10-CM | POA: Diagnosis not present

## 2022-06-05 LAB — COLOGUARD: COLOGUARD: NEGATIVE

## 2022-07-20 ENCOUNTER — Telehealth: Payer: Self-pay | Admitting: Family Medicine

## 2022-07-20 LAB — HM DIABETES EYE EXAM

## 2022-07-20 NOTE — Telephone Encounter (Signed)
This has been received and reviewed by Dettinger. An appt for the Craig Allison is scheduled for 6/13. Craig Allison is aware. Records on Dettinger's desk.

## 2022-07-23 ENCOUNTER — Encounter: Payer: Self-pay | Admitting: Family Medicine

## 2022-07-23 ENCOUNTER — Ambulatory Visit: Payer: BC Managed Care – PPO | Admitting: Family Medicine

## 2022-07-23 VITALS — BP 125/85 | HR 96 | Ht 70.0 in | Wt 181.0 lb

## 2022-07-23 DIAGNOSIS — H532 Diplopia: Secondary | ICD-10-CM | POA: Diagnosis not present

## 2022-07-23 DIAGNOSIS — H539 Unspecified visual disturbance: Secondary | ICD-10-CM

## 2022-07-23 DIAGNOSIS — E1169 Type 2 diabetes mellitus with other specified complication: Secondary | ICD-10-CM | POA: Diagnosis not present

## 2022-07-23 LAB — BAYER DCA HB A1C WAIVED: HB A1C (BAYER DCA - WAIVED): 6.9 % — ABNORMAL HIGH (ref 4.8–5.6)

## 2022-07-23 NOTE — Progress Notes (Signed)
BP 125/85   Pulse 96   Ht 5\' 10"  (1.778 m)   Wt 181 lb (82.1 kg)   SpO2 97%   BMI 25.97 kg/m    Subjective:   Patient ID: Craig Allison, male    DOB: March 04, 1970, 52 y.o.   MRN: [id number]  HPI: Craig Allison is a 52 y.o. 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  He did go see an optometrist and they did say he had some small retinal hemorrhages that they were taking is affecting his vision.  He says it came on all of a sudden started 5 days ago and has not improved at all.  He denies any headaches or sinus congestion or pressure or lightheadedness or dizziness or chest pain at or shortness of breath or leg swelling.  He says his blood sugars been running between 101 120.  Relevant past medical, surgical, family and social history reviewed and updated as indicated. Interim medical history since our last visit reviewed. Allergies and medications reviewed and updated.  Review of Systems  Constitutional:  Negative for chills and fever.  Eyes:  Positive for visual disturbance. Negative for photophobia, pain, discharge and itching.  Respiratory:  Negative for shortness of breath and wheezing.   Cardiovascular:  Negative for chest pain and leg swelling.  Musculoskeletal:  Negative for back pain and gait problem.  Skin:  Negative for rash.  All other systems reviewed and are negative.   Per HPI unless specifically indicated above   Allergies as of 07/23/2022   No Known Allergies      Medication List        Accurate as of July 23, 2022  9:25 AM. If you have any questions, ask your nurse or doctor.          rosuvastatin 10 MG tablet Commonly known as: Crestor Take 1 tablet (10 mg total) by mouth at bedtime.   tirzepatide 10  MG/0.5ML Pen Commonly known as: MOUNJARO Inject 10 mg into the skin once a week.         Objective:   BP 125/85   Pulse 96   Ht 5\' 10"  (1.778 m)   Wt 181 lb (82.1 kg)   SpO2 97%   BMI 25.97 kg/m   Wt Readings from Last 3 Encounters:  07/23/22 181 lb (82.1 kg)  05/20/22 183 lb (83 kg)  02/13/22 183 lb (83 kg)    Physical Exam Vitals and nursing note reviewed.  Constitutional:      General: He is not in acute distress.    Appearance: He is well-developed. He is not diaphoretic.  Eyes:     General: No scleral icterus.       Right eye: No discharge.        Left eye: No discharge.     Extraocular Movements: Extraocular movements intact.     Conjunctiva/sclera: Conjunctivae normal.     Right eye: Right conjunctiva is not injected. No exudate.    Left eye: Left conjunctiva is not injected. No exudate.    Pupils: Pupils are equal, round, and reactive to light.  Neck:     Thyroid: No thyromegaly.  Cardiovascular:     Rate and Rhythm: Normal rate and regular rhythm.  Heart sounds: Normal heart sounds. No murmur heard. Pulmonary:     Effort: Pulmonary effort is normal. No respiratory distress.     Breath sounds: Normal breath sounds. No wheezing.  Musculoskeletal:        General: Normal range of motion.     Cervical back: Neck supple.  Lymphadenopathy:     Cervical: No cervical adenopathy.  Skin:    General: Skin is warm and dry.     Findings: No rash.  Neurological:     Mental Status: He is alert and oriented to person, place, and time.     Coordination: Coordination normal.  Psychiatric:        Behavior: Behavior normal.       Assessment & Plan:   Problem List Items Addressed This Visit   None Visit Diagnoses     Diplopia    -  Primary   Relevant Orders   CBC with Differential/Platelet   CMP14+EGFR   TSH   Bayer DCA Hb A1c Waived   Ambulatory referral to Ophthalmology   Vision changes       Relevant Orders   CBC with Differential/Platelet    CMP14+EGFR   TSH   Bayer DCA Hb A1c Waived   Ambulatory referral to Ophthalmology       Will do blood work to test to make sure his blood sugars and make sure he does not have any thyroid disease or anything else that could cause this.  Also placed urgent referral to ophthalmology. Follow up plan: Return if symptoms worsen or fail to improve.  Counseling provided for all of the vaccine components Orders Placed This Encounter  Procedures   CBC with Differential/Platelet   CMP14+EGFR   TSH   Bayer DCA Hb A1c Waived   Ambulatory referral to Ophthalmology    Arville Care, MD [facility] 07/23/2022, 9:25 AM

## 2022-07-24 LAB — CMP14+EGFR
ALT: 24 IU/L (ref 0–44)
AST: 16 IU/L (ref 0–40)
Albumin/Globulin Ratio: 1.8
Albumin: 4.5 g/dL (ref 3.8–4.9)
Alkaline Phosphatase: 49 IU/L (ref 44–121)
BUN/Creatinine Ratio: 15 (ref 9–20)
BUN: 17 mg/dL (ref 6–24)
Bilirubin Total: 0.3 mg/dL (ref 0.0–1.2)
CO2: 21 mmol/L (ref 20–29)
Calcium: 10.5 mg/dL — ABNORMAL HIGH (ref 8.7–10.2)
Chloride: 103 mmol/L (ref 96–106)
Creatinine, Ser: 1.11 mg/dL (ref 0.76–1.27)
Globulin, Total: 2.5 g/dL (ref 1.5–4.5)
Glucose: 135 mg/dL — ABNORMAL HIGH (ref 70–99)
Potassium: 4.4 mmol/L (ref 3.5–5.2)
Sodium: 141 mmol/L (ref 134–144)
Total Protein: 7 g/dL (ref 6.0–8.5)
eGFR: 80 mL/min/1.73 (ref >59)

## 2022-07-24 LAB — CBC WITH DIFFERENTIAL/PLATELET
Basophils Absolute: 0.1 x10E3/uL (ref 0.0–0.2)
Basos: 1 %
EOS (ABSOLUTE): 0.2 x10E3/uL (ref 0.0–0.4)
Eos: 3 %
Hematocrit: 47.2 % (ref 37.5–51.0)
Hemoglobin: 16.2 g/dL (ref 13.0–17.7)
Immature Grans (Abs): 0 x10E3/uL (ref 0.0–0.1)
Immature Granulocytes: 0 %
Lymphocytes Absolute: 2.6 x10E3/uL (ref 0.7–3.1)
Lymphs: 37 %
MCH: 30.9 pg (ref 26.6–33.0)
MCHC: 34.3 g/dL (ref 31.5–35.7)
MCV: 90 fL (ref 79–97)
Monocytes Absolute: 0.4 x10E3/uL (ref 0.1–0.9)
Monocytes: 6 %
Neutrophils Absolute: 3.7 x10E3/uL (ref 1.4–7.0)
Neutrophils: 53 %
Platelets: 193 x10E3/uL (ref 150–450)
RBC: 5.25 x10E6/uL (ref 4.14–5.80)
RDW: 12.5 % (ref 11.6–15.4)
WBC: 7 x10E3/uL (ref 3.4–10.8)

## 2022-07-24 LAB — TSH: TSH: 1.28 u[IU]/mL (ref 0.450–4.500)

## 2022-07-27 DIAGNOSIS — H5319 Other subjective visual disturbances: Secondary | ICD-10-CM | POA: Diagnosis not present

## 2022-07-27 DIAGNOSIS — E113293 Type 2 diabetes mellitus with mild nonproliferative diabetic retinopathy without macular edema, bilateral: Secondary | ICD-10-CM | POA: Diagnosis not present

## 2022-07-27 DIAGNOSIS — H532 Diplopia: Secondary | ICD-10-CM | POA: Diagnosis not present

## 2022-07-27 DIAGNOSIS — H4922 Sixth [abducent] nerve palsy, left eye: Secondary | ICD-10-CM | POA: Diagnosis not present

## 2022-08-10 DIAGNOSIS — H5319 Other subjective visual disturbances: Secondary | ICD-10-CM | POA: Diagnosis not present

## 2022-08-10 DIAGNOSIS — H3582 Retinal ischemia: Secondary | ICD-10-CM | POA: Diagnosis not present

## 2022-08-10 DIAGNOSIS — E113293 Type 2 diabetes mellitus with mild nonproliferative diabetic retinopathy without macular edema, bilateral: Secondary | ICD-10-CM | POA: Diagnosis not present

## 2022-08-10 DIAGNOSIS — H4922 Sixth [abducent] nerve palsy, left eye: Secondary | ICD-10-CM | POA: Diagnosis not present

## 2022-08-17 DIAGNOSIS — H4922 Sixth [abducent] nerve palsy, left eye: Secondary | ICD-10-CM | POA: Diagnosis not present

## 2022-08-21 ENCOUNTER — Ambulatory Visit: Payer: BC Managed Care – PPO | Admitting: Family Medicine

## 2022-08-24 ENCOUNTER — Encounter: Payer: Self-pay | Admitting: Family Medicine

## 2022-09-07 DIAGNOSIS — E113292 Type 2 diabetes mellitus with mild nonproliferative diabetic retinopathy without macular edema, left eye: Secondary | ICD-10-CM | POA: Diagnosis not present

## 2022-09-29 DIAGNOSIS — E113291 Type 2 diabetes mellitus with mild nonproliferative diabetic retinopathy without macular edema, right eye: Secondary | ICD-10-CM | POA: Diagnosis not present

## 2022-10-06 DIAGNOSIS — E113292 Type 2 diabetes mellitus with mild nonproliferative diabetic retinopathy without macular edema, left eye: Secondary | ICD-10-CM | POA: Diagnosis not present

## 2022-10-26 DIAGNOSIS — E113293 Type 2 diabetes mellitus with mild nonproliferative diabetic retinopathy without macular edema, bilateral: Secondary | ICD-10-CM | POA: Diagnosis not present

## 2022-10-26 DIAGNOSIS — H25813 Combined forms of age-related cataract, bilateral: Secondary | ICD-10-CM | POA: Diagnosis not present

## 2022-10-26 DIAGNOSIS — H3582 Retinal ischemia: Secondary | ICD-10-CM | POA: Diagnosis not present

## 2022-10-26 DIAGNOSIS — H43392 Other vitreous opacities, left eye: Secondary | ICD-10-CM | POA: Diagnosis not present

## 2022-11-06 ENCOUNTER — Ambulatory Visit: Payer: BC Managed Care – PPO | Admitting: Family Medicine

## 2022-11-10 DIAGNOSIS — E113292 Type 2 diabetes mellitus with mild nonproliferative diabetic retinopathy without macular edema, left eye: Secondary | ICD-10-CM | POA: Diagnosis not present

## 2022-12-07 ENCOUNTER — Encounter: Payer: Self-pay | Admitting: Family Medicine

## 2022-12-07 ENCOUNTER — Ambulatory Visit: Payer: BC Managed Care – PPO | Admitting: Family Medicine

## 2022-12-07 VITALS — BP 151/79 | HR 96 | Ht 70.0 in | Wt 191.0 lb

## 2022-12-07 DIAGNOSIS — K219 Gastro-esophageal reflux disease without esophagitis: Secondary | ICD-10-CM

## 2022-12-07 DIAGNOSIS — E785 Hyperlipidemia, unspecified: Secondary | ICD-10-CM

## 2022-12-07 DIAGNOSIS — E1169 Type 2 diabetes mellitus with other specified complication: Secondary | ICD-10-CM | POA: Diagnosis not present

## 2022-12-07 LAB — BAYER DCA HB A1C WAIVED: HB A1C (BAYER DCA - WAIVED): 6.9 % — ABNORMAL HIGH (ref 4.8–5.6)

## 2022-12-07 NOTE — Progress Notes (Signed)
BP (!) 151/79   Pulse 96   Ht 5\' 10"  (1.778 m)   Wt 191 lb (86.6 kg)   SpO2 98%   BMI 27.41 kg/m    Subjective:   Patient ID: ILIYA JOSEPHSON, male    DOB: 09/15/1970, 52 y.o.   MRN: [id number]  HPI: RUVIM ZAVATSKY is a 52 y.o. male presenting on 12/07/2022 for Medical Management of Chronic Issues, Diabetes, Hypertension, and Hyperlipidemia   HPI Type 2 diabetes mellitus Patient comes in today for recheck of his diabetes. Patient has been currently taking Mounjaro. Patient is not currently on an ACE inhibitor/ARB. Patient has seen an ophthalmologist this year. Patient denies any new issues with their feet. The symptom started onset as an adult hyperlipidemia and GERD ARE RELATED TO DM   Hyperlipidemia Patient is coming in for recheck of his hyperlipidemia. The patient is currently taking Crestor. They deny any issues with myalgias or history of liver damage from it. They deny any focal numbness or weakness or chest pain.   GERD Patient is currently on no medicine currently.  She denies any major symptoms or abdominal pain or belching or burping. She denies any blood in her stool or lightheadedness or dizziness.   Relevant past medical, surgical, family and social history reviewed and updated as indicated. Interim medical history since our last visit reviewed. Allergies and medications reviewed and updated.  Review of Systems  Constitutional:  Negative for chills and fever.  Eyes:  Positive for visual disturbance (Seeing off the).  Respiratory:  Negative for shortness of breath and wheezing.   Cardiovascular:  Negative for chest pain and leg swelling.  Musculoskeletal:  Negative for back pain and gait problem.  Skin:  Negative for rash.  All other systems reviewed and are negative.   Per HPI unless specifically indicated above   Allergies as of 12/07/2022   No Known Allergies      Medication List        Accurate as of December 07, 2022  4:18 PM. If you have any  questions, ask your nurse or doctor.          rosuvastatin 10 MG tablet Commonly known as: Crestor Take 1 tablet (10 mg total) by mouth at bedtime.   tirzepatide 10 MG/0.5ML Pen Commonly known as: MOUNJARO Inject 10 mg into the skin once a week.         Objective:   BP (!) 151/79   Pulse 96   Ht 5\' 10"  (1.778 m)   Wt 191 lb (86.6 kg)   SpO2 98%   BMI 27.41 kg/m   Wt Readings from Last 3 Encounters:  12/07/22 191 lb (86.6 kg)  07/23/22 181 lb (82.1 kg)  05/20/22 183 lb (83 kg)    Physical Exam Vitals and nursing note reviewed.  Constitutional:      General: He is not in acute distress.    Appearance: He is well-developed. He is not diaphoretic.  Eyes:     General: No scleral icterus.    Conjunctiva/sclera: Conjunctivae normal.  Neck:     Thyroid: No thyromegaly.  Cardiovascular:     Rate and Rhythm: Normal rate and regular rhythm.     Heart sounds: Normal heart sounds. No murmur heard. Pulmonary:     Effort: Pulmonary effort is normal. No respiratory distress.     Breath sounds: Normal breath sounds. No wheezing.  Musculoskeletal:        General: Normal range of motion.  Cervical back: Neck supple.  Lymphadenopathy:     Cervical: No cervical adenopathy.  Skin:    General: Skin is warm and dry.     Findings: No rash.  Neurological:     Mental Status: He is alert and oriented to person, place, and time.     Coordination: Coordination normal.  Psychiatric:        Behavior: Behavior normal.       Assessment & Plan:   Problem List Items Addressed This Visit       Digestive   GERD (gastroesophageal reflux disease)     Endocrine   Type 2 diabetes mellitus with other specified complication (HCC) - Primary   Relevant Orders   CBC with Differential/Platelet   CMP14+EGFR   Lipid panel   Bayer DCA Hb A1c Waived   Hyperlipidemia associated with type 2 diabetes mellitus (HCC)    A1c is 6.9 which is same as before, no changes. Follow up  plan: Return in about 3 months (around 03/09/2023), or if symptoms worsen or fail to improve, for Diabetes recheck.  Counseling provided for all of the vaccine components Orders Placed This Encounter  Procedures   CBC with Differential/Platelet   CMP14+EGFR   Lipid panel   Bayer DCA Hb A1c Waived    Arville Care, MD [facility] 12/07/2022, 4:18 PM

## 2022-12-08 ENCOUNTER — Other Ambulatory Visit: Payer: Self-pay

## 2022-12-08 LAB — CBC WITH DIFFERENTIAL/PLATELET
Basophils Absolute: 0.1 10*3/uL (ref 0.0–0.2)
Basos: 1 %
EOS (ABSOLUTE): 0.2 10*3/uL (ref 0.0–0.4)
Eos: 4 %
Hematocrit: 45.4 % (ref 37.5–51.0)
Hemoglobin: 15.1 g/dL (ref 13.0–17.7)
Immature Grans (Abs): 0 10*3/uL (ref 0.0–0.1)
Immature Granulocytes: 0 %
Lymphocytes Absolute: 2.3 10*3/uL (ref 0.7–3.1)
Lymphs: 42 %
MCH: 30.8 pg (ref 26.6–33.0)
MCHC: 33.3 g/dL (ref 31.5–35.7)
MCV: 93 fL (ref 79–97)
Monocytes Absolute: 0.4 10*3/uL (ref 0.1–0.9)
Monocytes: 7 %
Neutrophils Absolute: 2.6 10*3/uL (ref 1.4–7.0)
Neutrophils: 46 %
Platelets: 184 10*3/uL (ref 150–450)
RBC: 4.91 x10E6/uL (ref 4.14–5.80)
RDW: 12.5 % (ref 11.6–15.4)
WBC: 5.5 10*3/uL (ref 3.4–10.8)

## 2022-12-08 LAB — CMP14+EGFR
ALT: 19 [IU]/L (ref 0–44)
AST: 17 [IU]/L (ref 0–40)
Albumin: 4.1 g/dL (ref 3.8–4.9)
Alkaline Phosphatase: 47 [IU]/L (ref 44–121)
BUN/Creatinine Ratio: 11 (ref 9–20)
BUN: 11 mg/dL (ref 6–24)
Bilirubin Total: 0.3 mg/dL (ref 0.0–1.2)
CO2: 19 mmol/L — ABNORMAL LOW (ref 20–29)
Calcium: 10 mg/dL (ref 8.7–10.2)
Chloride: 102 mmol/L (ref 96–106)
Creatinine, Ser: 1 mg/dL (ref 0.76–1.27)
Globulin, Total: 2.7 g/dL (ref 1.5–4.5)
Glucose: 202 mg/dL — ABNORMAL HIGH (ref 70–99)
Potassium: 4 mmol/L (ref 3.5–5.2)
Sodium: 137 mmol/L (ref 134–144)
Total Protein: 6.8 g/dL (ref 6.0–8.5)
eGFR: 91 mL/min/{1.73_m2} (ref >59)

## 2022-12-08 LAB — LIPID PANEL
Chol/HDL Ratio: 7 ratio — ABNORMAL HIGH (ref 0.0–5.0)
Cholesterol, Total: 237 mg/dL — ABNORMAL HIGH (ref 100–199)
HDL: 34 mg/dL — ABNORMAL LOW (ref >39)
LDL Chol Calc (NIH): 158 mg/dL — ABNORMAL HIGH (ref 0–99)
Triglycerides: 242 mg/dL — ABNORMAL HIGH (ref 0–149)
VLDL Cholesterol Cal: 45 mg/dL — ABNORMAL HIGH (ref 5–40)

## 2022-12-08 MED ORDER — ROSUVASTATIN CALCIUM 10 MG PO TABS: 10.0000 mg | ORAL_TABLET | Freq: Every day | ORAL | 1 refills | Status: DC

## 2022-12-11 DIAGNOSIS — E113292 Type 2 diabetes mellitus with mild nonproliferative diabetic retinopathy without macular edema, left eye: Secondary | ICD-10-CM | POA: Diagnosis not present

## 2023-01-13 DIAGNOSIS — E113292 Type 2 diabetes mellitus with mild nonproliferative diabetic retinopathy without macular edema, left eye: Secondary | ICD-10-CM | POA: Diagnosis not present

## 2023-02-25 ENCOUNTER — Other Ambulatory Visit: Payer: Self-pay | Admitting: Family Medicine

## 2023-03-05 DIAGNOSIS — E113292 Type 2 diabetes mellitus with mild nonproliferative diabetic retinopathy without macular edema, left eye: Secondary | ICD-10-CM | POA: Diagnosis not present

## 2023-03-12 ENCOUNTER — Ambulatory Visit: Payer: BC Managed Care – PPO | Admitting: Family Medicine

## 2023-03-12 ENCOUNTER — Encounter: Payer: Self-pay | Admitting: Family Medicine

## 2023-03-12 VITALS — BP 132/78 | HR 94 | Ht 70.0 in | Wt 189.0 lb

## 2023-03-12 DIAGNOSIS — Z7985 Long-term (current) use of injectable non-insulin antidiabetic drugs: Secondary | ICD-10-CM | POA: Diagnosis not present

## 2023-03-12 DIAGNOSIS — E785 Hyperlipidemia, unspecified: Secondary | ICD-10-CM | POA: Diagnosis not present

## 2023-03-12 DIAGNOSIS — E1169 Type 2 diabetes mellitus with other specified complication: Secondary | ICD-10-CM | POA: Diagnosis not present

## 2023-03-12 LAB — BAYER DCA HB A1C WAIVED: HB A1C (BAYER DCA - WAIVED): 7.9 % — ABNORMAL HIGH (ref 4.8–5.6)

## 2023-03-12 LAB — LIPID PANEL

## 2023-03-12 MED ORDER — MOUNJARO 10 MG/0.5ML ~~LOC~~ SOAJ: 10.0000 mg | SUBCUTANEOUS | 3 refills | Status: DC

## 2023-03-12 MED ORDER — ROSUVASTATIN CALCIUM 10 MG PO TABS: 10.0000 mg | ORAL_TABLET | Freq: Every day | ORAL | 3 refills | Status: DC

## 2023-03-12 NOTE — Progress Notes (Signed)
BP 132/78   Pulse 94   Ht 5\' 10"  (1.778 m)   Wt 189 lb (85.7 kg)   SpO2 99%   BMI 27.12 kg/m    Subjective:   Patient ID: Craig Allison, male    DOB: 1970/08/25, 53 y.o.   MRN: [id number]  HPI: Craig Allison is a 53 y.o. male presenting on 03/12/2023 for Medical Management of Chronic Issues and Diabetes   HPI Type 2 diabetes mellitus Patient comes in today for recheck of his diabetes. Patient has been currently taking Mounjaro. Patient is not currently on an ACE inhibitor/ARB. Patient has not seen an ophthalmologist this year. Patient denies any new issues with their feet. The symptom started onset as an adult hyperlipidemia ARE RELATED TO DM   Hyperlipidemia Patient is coming in for recheck of his hyperlipidemia. The patient is currently taking Crestor. They deny any issues with myalgias or history of liver damage from it. They deny any focal numbness or weakness or chest pain.   Relevant past medical, surgical, family and social history reviewed and updated as indicated. Interim medical history since our last visit reviewed. Allergies and medications reviewed and updated.  Review of Systems  Constitutional:  Negative for chills and fever.  Eyes:  Negative for visual disturbance.  Respiratory:  Negative for shortness of breath and wheezing.   Cardiovascular:  Negative for chest pain and leg swelling.  Musculoskeletal:  Negative for back pain and gait problem.  Skin:  Negative for rash.  Neurological:  Negative for dizziness and light-headedness.  All other systems reviewed and are negative.   Per HPI unless specifically indicated above   Allergies as of 03/12/2023   No Known Allergies      Medication List        Accurate as of March 12, 2023  4:14 PM. If you have any questions, ask your nurse or doctor.          Mounjaro 10 MG/0.5ML Pen Generic drug: tirzepatide Inject 10 mg into the skin once a week. What changed: additional instructions Changed by:  Elige Radon Chava Dulac   rosuvastatin 10 MG tablet Commonly known as: Crestor Take 1 tablet (10 mg total) by mouth daily.         Objective:   BP 132/78   Pulse 94   Ht 5\' 10"  (1.778 m)   Wt 189 lb (85.7 kg)   SpO2 99%   BMI 27.12 kg/m   Wt Readings from Last 3 Encounters:  03/12/23 189 lb (85.7 kg)  12/07/22 191 lb (86.6 kg)  07/23/22 181 lb (82.1 kg)    Physical Exam Vitals and nursing note reviewed.  Constitutional:      General: He is not in acute distress.    Appearance: He is well-developed. He is not diaphoretic.  Eyes:     General: No scleral icterus.    Conjunctiva/sclera: Conjunctivae normal.  Neck:     Thyroid: No thyromegaly.  Cardiovascular:     Rate and Rhythm: Normal rate and regular rhythm.     Heart sounds: Normal heart sounds. No murmur heard. Pulmonary:     Effort: Pulmonary effort is normal. No respiratory distress.     Breath sounds: Normal breath sounds. No wheezing.  Musculoskeletal:        General: No swelling. Normal range of motion.     Cervical back: Neck supple.  Lymphadenopathy:     Cervical: No cervical adenopathy.  Skin:    General: Skin is warm and  dry.     Findings: No rash.  Neurological:     Mental Status: He is alert and oriented to person, place, and time.     Coordination: Coordination normal.  Psychiatric:        Behavior: Behavior normal.       Assessment & Plan:   Problem List Items Addressed This Visit       Endocrine   Type 2 diabetes mellitus with other specified complication (HCC) - Primary   Relevant Medications   tirzepatide (MOUNJARO) 10 MG/0.5ML Pen   rosuvastatin (CRESTOR) 10 MG tablet   Other Relevant Orders   CBC with Differential/Platelet   CMP14+EGFR   Lipid panel   Bayer DCA Hb A1c Waived   Microalbumin / creatinine urine ratio   Hyperlipidemia associated with type 2 diabetes mellitus (HCC)   Relevant Medications   tirzepatide (MOUNJARO) 10 MG/0.5ML Pen   rosuvastatin (CRESTOR) 10 MG  tablet   Other Relevant Orders   CBC with Differential/Platelet   CMP14+EGFR   Lipid panel   Bayer DCA Hb A1c Waived    A1c is up at 7.9.  Patient does not want to increase his Greggory Keen been having some stomach issues.    Blood pressure and everything else looks good.  No changes Follow up plan: Return in about 3 months (around 06/09/2023), or if symptoms worsen or fail to improve, for Diabetes recheck.  Counseling provided for all of the vaccine components Orders Placed This Encounter  Procedures   CBC with Differential/Platelet   CMP14+EGFR   Lipid panel   Bayer DCA Hb A1c Waived   Microalbumin / creatinine urine ratio    Arville Care, MD [facility] 03/12/2023, 4:14 PM

## 2023-03-13 LAB — CBC WITH DIFFERENTIAL/PLATELET
Basophils Absolute: 0.1 10*3/uL (ref 0.0–0.2)
Basos: 1 %
EOS (ABSOLUTE): 0.2 10*3/uL (ref 0.0–0.4)
Eos: 3 %
Hematocrit: 44.1 % (ref 37.5–51.0)
Hemoglobin: 14.7 g/dL (ref 13.0–17.7)
Immature Grans (Abs): 0 10*3/uL (ref 0.0–0.1)
Immature Granulocytes: 0 %
Lymphocytes Absolute: 3.3 10*3/uL — ABNORMAL HIGH (ref 0.7–3.1)
Lymphs: 44 %
MCH: 30.6 pg (ref 26.6–33.0)
MCHC: 33.3 g/dL (ref 31.5–35.7)
MCV: 92 fL (ref 79–97)
Monocytes Absolute: 0.5 10*3/uL (ref 0.1–0.9)
Monocytes: 6 %
Neutrophils Absolute: 3.4 10*3/uL (ref 1.4–7.0)
Neutrophils: 46 %
Platelets: 197 10*3/uL (ref 150–450)
RBC: 4.81 x10E6/uL (ref 4.14–5.80)
RDW: 12.3 % (ref 11.6–15.4)
WBC: 7.5 10*3/uL (ref 3.4–10.8)

## 2023-03-13 LAB — LIPID PANEL
Cholesterol, Total: 143 mg/dL (ref 100–199)
HDL: 35 mg/dL — ABNORMAL LOW (ref >39)
LDL CALC COMMENT:: 4.1 ratio (ref 0.0–5.0)
LDL Chol Calc (NIH): 77 mg/dL (ref 0–99)
Triglycerides: 180 mg/dL — ABNORMAL HIGH (ref 0–149)
VLDL Cholesterol Cal: 31 mg/dL (ref 5–40)

## 2023-03-13 LAB — CMP14+EGFR
ALT: 22 IU/L (ref 0–44)
AST: 16 IU/L (ref 0–40)
Albumin: 4.2 g/dL (ref 3.8–4.9)
Alkaline Phosphatase: 51 IU/L (ref 44–121)
BUN/Creatinine Ratio: 13 (ref 9–20)
BUN: 15 mg/dL (ref 6–24)
Bilirubin Total: 0.4 mg/dL (ref 0.0–1.2)
CO2: 20 mmol/L (ref 20–29)
Calcium: 9.9 mg/dL (ref 8.7–10.2)
Chloride: 103 mmol/L (ref 96–106)
Creatinine, Ser: 1.14 mg/dL (ref 0.76–1.27)
Globulin, Total: 2.4 g/dL (ref 1.5–4.5)
Glucose: 194 mg/dL — ABNORMAL HIGH (ref 70–99)
Potassium: 4.1 mmol/L (ref 3.5–5.2)
Sodium: 139 mmol/L (ref 134–144)
Total Protein: 6.6 g/dL (ref 6.0–8.5)
eGFR: 77 mL/min/{1.73_m2} (ref >59)

## 2023-03-19 DIAGNOSIS — E113293 Type 2 diabetes mellitus with mild nonproliferative diabetic retinopathy without macular edema, bilateral: Secondary | ICD-10-CM | POA: Diagnosis not present

## 2023-03-19 DIAGNOSIS — H3582 Retinal ischemia: Secondary | ICD-10-CM | POA: Diagnosis not present

## 2023-03-19 DIAGNOSIS — H2513 Age-related nuclear cataract, bilateral: Secondary | ICD-10-CM | POA: Diagnosis not present

## 2023-03-19 LAB — HM DIABETES EYE EXAM

## 2023-03-24 ENCOUNTER — Encounter: Payer: Self-pay | Admitting: Family Medicine

## 2023-04-09 DIAGNOSIS — E113391 Type 2 diabetes mellitus with moderate nonproliferative diabetic retinopathy without macular edema, right eye: Secondary | ICD-10-CM | POA: Diagnosis not present

## 2023-04-23 DIAGNOSIS — E113292 Type 2 diabetes mellitus with mild nonproliferative diabetic retinopathy without macular edema, left eye: Secondary | ICD-10-CM | POA: Diagnosis not present

## 2023-05-14 DIAGNOSIS — E113391 Type 2 diabetes mellitus with moderate nonproliferative diabetic retinopathy without macular edema, right eye: Secondary | ICD-10-CM | POA: Diagnosis not present

## 2023-06-10 ENCOUNTER — Ambulatory Visit: Payer: BC Managed Care – PPO | Admitting: Family Medicine

## 2023-06-11 DIAGNOSIS — E113292 Type 2 diabetes mellitus with mild nonproliferative diabetic retinopathy without macular edema, left eye: Secondary | ICD-10-CM | POA: Diagnosis not present

## 2023-06-18 DIAGNOSIS — E113391 Type 2 diabetes mellitus with moderate nonproliferative diabetic retinopathy without macular edema, right eye: Secondary | ICD-10-CM | POA: Diagnosis not present

## 2023-06-30 ENCOUNTER — Encounter: Payer: Self-pay | Admitting: Family Medicine

## 2023-06-30 ENCOUNTER — Ambulatory Visit: Admitting: Family Medicine

## 2023-06-30 VITALS — BP 115/70 | HR 100 | Ht 70.0 in | Wt 186.0 lb

## 2023-06-30 DIAGNOSIS — E1169 Type 2 diabetes mellitus with other specified complication: Secondary | ICD-10-CM

## 2023-06-30 DIAGNOSIS — E785 Hyperlipidemia, unspecified: Secondary | ICD-10-CM

## 2023-06-30 DIAGNOSIS — Z7985 Long-term (current) use of injectable non-insulin antidiabetic drugs: Secondary | ICD-10-CM | POA: Diagnosis not present

## 2023-06-30 LAB — BAYER DCA HB A1C WAIVED: HB A1C (BAYER DCA - WAIVED): 7.2 % — ABNORMAL HIGH (ref 4.8–5.6)

## 2023-06-30 MED ORDER — MOUNJARO 10 MG/0.5ML ~~LOC~~ SOAJ: 10.0000 mg | SUBCUTANEOUS | 3 refills | Status: DC

## 2023-06-30 NOTE — Progress Notes (Signed)
 BP 115/70   Pulse 100   Ht 5\' 10"  (1.778 m)   Wt 186 lb (84.4 kg)   SpO2 96%   BMI 26.69 kg/m    Subjective:   Patient ID: Craig Allison, male    DOB: 1970/09/09, 53 y.o.   MRN: [id number]  HPI: Craig Allison is a 53 y.o. male presenting on 06/30/2023 for Medical Management of Chronic Issues and Diabetes   HPI Type 2 diabetes mellitus Patient comes in today for recheck of his diabetes. Patient has been currently taking mounjaro . Patient is not currently on an ACE inhibitor/ARB. Patient has not seen an ophthalmologist this year. Patient denies any new issues with their feet. The symptom started onset as an adult hyperlipidemia ARE RELATED TO DM   Hyperlipidemia Patient is coming in for recheck of his hyperlipidemia. The patient is currently taking Crestor . They deny any issues with myalgias or history of liver damage from it. They deny any focal numbness or weakness or chest pain.   Relevant past medical, surgical, family and social history reviewed and updated as indicated. Interim medical history since our last visit reviewed. Allergies and medications reviewed and updated.  Review of Systems  Constitutional:  Negative for chills and fever.  Eyes:  Negative for visual disturbance.  Respiratory:  Negative for shortness of breath and wheezing.   Cardiovascular:  Negative for chest pain and leg swelling.  Musculoskeletal:  Negative for back pain and gait problem.  Skin:  Negative for rash.  Neurological:  Negative for dizziness and light-headedness.  All other systems reviewed and are negative.   Per HPI unless specifically indicated above   Allergies as of 06/30/2023   No Known Allergies      Medication List        Accurate as of Jun 30, 2023  3:27 PM. If you have any questions, ask your nurse or doctor.          Mounjaro  10 MG/0.5ML Pen Generic drug: tirzepatide  Inject 10 mg into the skin once a week.   rosuvastatin  10 MG tablet Commonly known as:  Crestor  Take 1 tablet (10 mg total) by mouth daily.         Objective:   BP 115/70   Pulse 100   Ht 5\' 10"  (1.778 m)   Wt 186 lb (84.4 kg)   SpO2 96%   BMI 26.69 kg/m   Wt Readings from Last 3 Encounters:  06/30/23 186 lb (84.4 kg)  03/12/23 189 lb (85.7 kg)  12/07/22 191 lb (86.6 kg)    Physical Exam Vitals and nursing note reviewed.  Constitutional:      General: He is not in acute distress.    Appearance: He is well-developed. He is not diaphoretic.  Eyes:     General: No scleral icterus.    Conjunctiva/sclera: Conjunctivae normal.  Neck:     Thyroid: No thyromegaly.  Cardiovascular:     Rate and Rhythm: Normal rate and regular rhythm.     Heart sounds: Normal heart sounds. No murmur heard. Pulmonary:     Effort: Pulmonary effort is normal. No respiratory distress.     Breath sounds: Normal breath sounds. No wheezing.  Musculoskeletal:        General: Normal range of motion.     Cervical back: Neck supple.  Lymphadenopathy:     Cervical: No cervical adenopathy.  Skin:    General: Skin is warm and dry.     Findings: No rash.  Neurological:  Mental Status: He is alert and oriented to person, place, and time.     Coordination: Coordination normal.  Psychiatric:        Behavior: Behavior normal.       Assessment & Plan:   Problem List Items Addressed This Visit       Endocrine   Type 2 diabetes mellitus with other specified complication (HCC) - Primary   Relevant Orders   Bayer DCA Hb A1c Waived   Microalbumin/Creatinine Ratio, Urine   Hyperlipidemia associated with type 2 diabetes mellitus (HCC)  A1c is 7.2 Which is actually better than last time but is going in the right direction.  Blood pressure and everything else looks good today.  No changes  Follow up plan: Return in about 3 months (around 09/30/2023), or if symptoms worsen or fail to improve, for Diabetes recheck.  Counseling provided for all of the vaccine components Orders Placed  This Encounter  Procedures   Bayer DCA Hb A1c Waived   Microalbumin/Creatinine Ratio, Urine    Jolyne Needs, MD [facility] 06/30/2023, 3:27 PM

## 2023-06-30 NOTE — Addendum Note (Signed)
 Addended by: Jolyne Needs on: 06/30/2023 03:31 PM   Modules accepted: Orders

## 2023-07-15 ENCOUNTER — Telehealth: Payer: Self-pay

## 2023-07-15 ENCOUNTER — Other Ambulatory Visit: Payer: Self-pay | Admitting: Family Medicine

## 2023-07-15 MED ORDER — MOUNJARO 10 MG/0.5ML ~~LOC~~ SOAJ: 10.0000 mg | SUBCUTANEOUS | 3 refills | Status: AC

## 2023-07-15 NOTE — Telephone Encounter (Signed)
 Copied from CRM [id number]. Topic: Clinical - Prescription Issue >> Jul 15, 2023 10:50 AM Craig Allison wrote: Reason for CRM: Patient is calling in regarding his tirzepatide  (MOUNJARO ) 10 MG/0.5ML Pen, states that the pharmacy is saying it was denied? Please clarify with patient, best call back number [phone]

## 2023-07-15 NOTE — Telephone Encounter (Signed)
 Resent to [facility]

## 2023-07-15 NOTE — Addendum Note (Signed)
 Addended by: Alexandro Angelica F on: 07/15/2023 11:43 AM   Modules accepted: Orders

## 2023-07-15 NOTE — Telephone Encounter (Signed)
 The patient returned the call and I relayed why the medicine was denied and that it was sent to [facility] in [place] and he said he will come them and hopefully just go get it there. He also said thank you

## 2023-07-15 NOTE — Telephone Encounter (Signed)
 Copied from CRM [id number]. Topic: Clinical - Prescription Issue >> Jul 15, 2023 10:50 AM Sophia H wrote: Reason for CRM: Patient is calling in regarding his tirzepatide  (MOUNJARO ) 10 MG/0.5ML Pen, states that the pharmacy is saying it was denied? Please clarify with patient, best call back number [phone] >> Jul 15, 2023 11:27 AM Chuck Crater wrote: Patient called [facility] and was advised that Mounjaro  need to be sent to [facility] on [address]. He wants to completely take [facility] off of his list.

## 2023-07-15 NOTE — Telephone Encounter (Signed)
 CAlled to let patient no it was denied because it was sent on 05/21 with refills to the drug store in [place]. Left message

## 2023-07-16 DIAGNOSIS — E113292 Type 2 diabetes mellitus with mild nonproliferative diabetic retinopathy without macular edema, left eye: Secondary | ICD-10-CM | POA: Diagnosis not present

## 2023-07-16 DIAGNOSIS — H3582 Retinal ischemia: Secondary | ICD-10-CM | POA: Diagnosis not present

## 2023-07-16 DIAGNOSIS — H2513 Age-related nuclear cataract, bilateral: Secondary | ICD-10-CM | POA: Diagnosis not present

## 2023-07-16 DIAGNOSIS — E113391 Type 2 diabetes mellitus with moderate nonproliferative diabetic retinopathy without macular edema, right eye: Secondary | ICD-10-CM | POA: Diagnosis not present

## 2023-07-28 ENCOUNTER — Encounter: Payer: Self-pay | Admitting: Family Medicine

## 2023-08-06 DIAGNOSIS — E113391 Type 2 diabetes mellitus with moderate nonproliferative diabetic retinopathy without macular edema, right eye: Secondary | ICD-10-CM | POA: Diagnosis not present

## 2023-08-27 ENCOUNTER — Encounter: Payer: Self-pay | Admitting: Advanced Practice Midwife

## 2023-09-24 DIAGNOSIS — E113391 Type 2 diabetes mellitus with moderate nonproliferative diabetic retinopathy without macular edema, right eye: Secondary | ICD-10-CM | POA: Diagnosis not present

## 2023-09-30 ENCOUNTER — Ambulatory Visit: Admitting: Family Medicine

## 2023-09-30 ENCOUNTER — Encounter: Payer: Self-pay | Admitting: Family Medicine

## 2023-09-30 VITALS — BP 158/135 | HR 106 | Ht 70.0 in | Wt 187.0 lb

## 2023-09-30 DIAGNOSIS — K219 Gastro-esophageal reflux disease without esophagitis: Secondary | ICD-10-CM | POA: Diagnosis not present

## 2023-09-30 DIAGNOSIS — Z125 Encounter for screening for malignant neoplasm of prostate: Secondary | ICD-10-CM | POA: Diagnosis not present

## 2023-09-30 DIAGNOSIS — E1169 Type 2 diabetes mellitus with other specified complication: Secondary | ICD-10-CM | POA: Diagnosis not present

## 2023-09-30 DIAGNOSIS — Z7985 Long-term (current) use of injectable non-insulin antidiabetic drugs: Secondary | ICD-10-CM

## 2023-09-30 DIAGNOSIS — E785 Hyperlipidemia, unspecified: Secondary | ICD-10-CM | POA: Diagnosis not present

## 2023-09-30 LAB — BAYER DCA HB A1C WAIVED: HB A1C (BAYER DCA - WAIVED): 7.5 % — ABNORMAL HIGH (ref 4.8–5.6)

## 2023-09-30 LAB — LIPID PANEL

## 2023-09-30 MED ORDER — ROSUVASTATIN CALCIUM 10 MG PO TABS: 10.0000 mg | ORAL_TABLET | ORAL | 3 refills | Status: AC

## 2023-09-30 NOTE — Progress Notes (Addendum)
 BP (!) 158/135   Pulse (!) 106   Ht 5' 10 (1.778 m)   Wt 187 lb (84.8 kg)   SpO2 98%   BMI 26.83 kg/m    Subjective:   Patient ID: Craig Allison, male    DOB: Jul 06, 1970, 53 y.o.   MRN: [id number]  HPI: Craig Allison is a 53 y.o. male presenting on 09/30/2023 for Medical Management of Chronic Issues, Diabetes, and Hyperlipidemia   Discussed the use of AI scribe software for clinical note transcription with the patient, who gave verbal consent to proceed.  History of Present Illness   Craig Allison is a 53 year old male with type 2 diabetes, hyperlipidemia, and GERD who presents for a recheck.  He is managing his type 2 diabetes with Mounjaro  10 mg and has not experienced gastrointestinal issues with the medication. He suspects his blood sugar levels might have been elevated recently, although he is unsure. A few months ago, he experienced an episode of feeling extremely cold, clammy, and was unable to use his phone, which he attributes to possibly low blood sugar. He has irregular eating habits, often skipping meals due to being busy, which may contribute to his symptoms.  Regarding hyperlipidemia, he has been inconsistent with his cholesterol medication, sometimes taking it every other day or forgetting to take it altogether. He took a dose yesterday and is considering taking it every other day to manage side effects.  For GERD, he reports worsening symptoms and sometimes needs to take up to three over-the-counter Prilosec pills at a time. He experiences reflux particularly after consuming certain foods like pizza or steak with A1 sauce. His symptoms can be managed with medication but are influenced by his diet and meal timing, especially when eating late at night.  In terms of social history, he is busy at home and work, often not taking time to eat during the day. He works flexible hours and prefers working at night to avoid people. No chest pain, trouble breathing, or any  recent illness like COVID. He reports a little cough yesterday, possibly due to seasonal allergies, but no stomach pain or swelling.          Relevant past medical, surgical, family and social history reviewed and updated as indicated. Interim medical history since our last visit reviewed. Allergies and medications reviewed and updated.  Review of Systems  Constitutional:  Negative for chills and fever.  Eyes:  Negative for visual disturbance.  Respiratory:  Negative for shortness of breath and wheezing.   Cardiovascular:  Negative for chest pain and leg swelling.  Musculoskeletal:  Negative for back pain and gait problem.  Skin:  Negative for rash.  Neurological:  Negative for dizziness and light-headedness.  All other systems reviewed and are negative.   Per HPI unless specifically indicated above   Allergies as of 09/30/2023   No Known Allergies      Medication List        Accurate as of September 30, 2023  3:14 PM. If you have any questions, ask your nurse or doctor.          Mounjaro  10 MG/0.5ML Pen Generic drug: tirzepatide  Inject 10 mg into the skin once a week.   omeprazole 20 MG tablet Commonly known as: PRILOSEC OTC Take 20 mg by mouth daily.   rosuvastatin  10 MG tablet Commonly known as: Crestor  Take 1 tablet (10 mg total) by mouth every other day. What changed: when to take this Changed  by: Fonda LABOR Peretz Thieme         Objective:   BP (!) 158/135   Pulse (!) 106   Ht 5' 10 (1.778 m)   Wt 187 lb (84.8 kg)   SpO2 98%   BMI 26.83 kg/m   Wt Readings from Last 3 Encounters:  09/30/23 187 lb (84.8 kg)  06/30/23 186 lb (84.4 kg)  03/12/23 189 lb (85.7 kg)    Physical Exam Vitals and nursing note reviewed.  Constitutional:      General: He is not in acute distress.    Appearance: He is well-developed. He is not diaphoretic.  Eyes:     General: No scleral icterus.    Conjunctiva/sclera: Conjunctivae normal.  Neck:     Thyroid: No  thyromegaly.  Cardiovascular:     Rate and Rhythm: Normal rate and regular rhythm.     Heart sounds: Normal heart sounds. No murmur heard. Pulmonary:     Effort: Pulmonary effort is normal. No respiratory distress.     Breath sounds: Normal breath sounds. No wheezing.  Musculoskeletal:        General: No swelling. Normal range of motion.     Cervical back: Neck supple.  Lymphadenopathy:     Cervical: No cervical adenopathy.  Skin:    General: Skin is warm and dry.     Findings: No rash.  Neurological:     Mental Status: He is alert and oriented to person, place, and time.     Coordination: Coordination normal.  Psychiatric:        Behavior: Behavior normal.              Results for orders placed or performed in visit on 07/29/23  HM DIABETES EYE EXAM   Collection Time: 03/19/23  9:36 AM  Result Value Ref Range   HM Diabetic Eye Exam Retinopathy (A) No Retinopathy    Assessment & Plan:   Problem List Items Addressed This Visit       Digestive   GERD (gastroesophageal reflux disease)   Relevant Medications   omeprazole (PRILOSEC OTC) 20 MG tablet     Endocrine   Type 2 diabetes mellitus with other specified complication (HCC) - Primary   Relevant Medications   rosuvastatin  (CRESTOR ) 10 MG tablet   Other Relevant Orders   Microalbumin / creatinine urine ratio   Hyperlipidemia associated with type 2 diabetes mellitus (HCC)   Relevant Medications   rosuvastatin  (CRESTOR ) 10 MG tablet   Other Relevant Orders   Bayer DCA Hb A1c Waived   CBC with Differential/Platelet   CMP14+EGFR   Lipid panel   Other Visit Diagnoses       Prostate cancer screening       Relevant Orders   PSA, total and free          Type 2 diabetes mellitus Managed with Mounjaro  10 mg. Possible hypoglycemia episode linked to inconsistent eating habits. - Continue Mounjaro  10 mg as prescribed. - Encourage regular meal schedule. - A1c 7.5, slightly up from last time, he is to refocus on  diet and reevaluate next time.  Hyperlipidemia Inconsistent adherence to cholesterol medication due to side effects. Alternative dosing strategy discussed. - Take cholesterol medication every other day. - Monitor cholesterol levels for efficacy.  Gastroesophageal reflux disease (GERD) Intermittent symptoms managed with Prilosec. Triggered by certain foods and late eating. - Continue Prilosec as needed. - Avoid trigger foods, especially with tomato sauce or grease. - Avoid eating late at night. -  Report worsening symptoms or increased medication use.          Follow up plan: Return in about 3 months (around 12/31/2023), or if symptoms worsen or fail to improve, for Diabetes recheck.  Counseling provided for all of the vaccine components Orders Placed This Encounter  Procedures   Bayer DCA Hb A1c Waived   CBC with Differential/Platelet   CMP14+EGFR   Lipid panel   PSA, total and free   Microalbumin / creatinine urine ratio    Fonda Levins, MD [facility] 09/30/2023, 3:14 PM

## 2023-10-01 ENCOUNTER — Ambulatory Visit: Payer: Self-pay | Admitting: Family Medicine

## 2023-10-01 LAB — CMP14+EGFR
ALT: 24 IU/L (ref 0–44)
AST: 23 IU/L (ref 0–40)
Albumin: 4.3 g/dL (ref 3.8–4.9)
Alkaline Phosphatase: 49 IU/L (ref 44–121)
BUN/Creatinine Ratio: 14 (ref 9–20)
BUN: 21 mg/dL (ref 6–24)
Bilirubin Total: 0.7 mg/dL (ref 0.0–1.2)
CO2: 20 mmol/L (ref 20–29)
Calcium: 10.3 mg/dL — AB (ref 8.7–10.2)
Chloride: 103 mmol/L (ref 96–106)
Creatinine, Ser: 1.48 mg/dL — AB (ref 0.76–1.27)
Globulin, Total: 2.7 g/dL (ref 1.5–4.5)
Glucose: 171 mg/dL — AB (ref 70–99)
Potassium: 4.4 mmol/L (ref 3.5–5.2)
Sodium: 138 mmol/L (ref 134–144)
Total Protein: 7 g/dL (ref 6.0–8.5)
eGFR: 56 mL/min/1.73 — AB (ref >59)

## 2023-10-01 LAB — PSA, TOTAL AND FREE
PSA, Free Pct: 46
PSA, Free: 0.23 ng/mL
Prostate Specific Ag, Serum: 0.5 ng/mL (ref 0.0–4.0)

## 2023-10-01 LAB — CBC WITH DIFFERENTIAL/PLATELET
Basophils Absolute: 0.1 x10E3/uL (ref 0.0–0.2)
Basos: 1 %
EOS (ABSOLUTE): 0.2 x10E3/uL (ref 0.0–0.4)
Eos: 2 %
Hematocrit: 46.9 % (ref 37.5–51.0)
Hemoglobin: 16 g/dL (ref 13.0–17.7)
Immature Grans (Abs): 0 x10E3/uL (ref 0.0–0.1)
Immature Granulocytes: 0 %
Lymphocytes Absolute: 2.8 x10E3/uL (ref 0.7–3.1)
Lymphs: 38 %
MCH: 31.1 pg (ref 26.6–33.0)
MCHC: 34.1 g/dL (ref 31.5–35.7)
MCV: 91 fL (ref 79–97)
Monocytes Absolute: 0.5 x10E3/uL (ref 0.1–0.9)
Monocytes: 6 %
Neutrophils Absolute: 3.8 x10E3/uL (ref 1.4–7.0)
Neutrophils: 53 %
Platelets: 184 x10E3/uL (ref 150–450)
RBC: 5.14 x10E6/uL (ref 4.14–5.80)
RDW: 12.4 % (ref 11.6–15.4)
WBC: 7.2 x10E3/uL (ref 3.4–10.8)

## 2023-10-01 LAB — LIPID PANEL
Cholesterol, Total: 143 mg/dL (ref 100–199)
HDL: 38 mg/dL — AB (ref >39)
LDL CALC COMMENT:: 3.8 ratio (ref 0.0–5.0)
LDL Chol Calc (NIH): 83 mg/dL (ref 0–99)
Triglycerides: 120 mg/dL (ref 0–149)
VLDL Cholesterol Cal: 22 mg/dL (ref 5–40)

## 2023-10-05 ENCOUNTER — Other Ambulatory Visit: Payer: Self-pay

## 2023-10-05 DIAGNOSIS — N289 Disorder of kidney and ureter, unspecified: Secondary | ICD-10-CM

## 2023-10-14 ENCOUNTER — Other Ambulatory Visit

## 2023-10-14 DIAGNOSIS — N289 Disorder of kidney and ureter, unspecified: Secondary | ICD-10-CM

## 2023-10-14 LAB — BMP8+EGFR
BUN/Creatinine Ratio: 12 (ref 9–20)
BUN: 14 mg/dL (ref 6–24)
CO2: 22 mmol/L (ref 20–29)
Calcium: 10.2 mg/dL (ref 8.7–10.2)
Chloride: 100 mmol/L (ref 96–106)
Creatinine, Ser: 1.2 mg/dL (ref 0.76–1.27)
Glucose: 135 mg/dL — ABNORMAL HIGH (ref 70–99)
Potassium: 4.5 mmol/L (ref 3.5–5.2)
Sodium: 137 mmol/L (ref 134–144)
eGFR: 72 mL/min/1.73 (ref >59)

## 2023-10-20 ENCOUNTER — Ambulatory Visit: Payer: Self-pay | Admitting: Family Medicine

## 2024-01-14 ENCOUNTER — Ambulatory Visit: Payer: Self-pay | Admitting: Family Medicine

## 2024-01-14 DIAGNOSIS — E113391 Type 2 diabetes mellitus with moderate nonproliferative diabetic retinopathy without macular edema, right eye: Secondary | ICD-10-CM | POA: Diagnosis not present

## 2024-01-21 DIAGNOSIS — H2513 Age-related nuclear cataract, bilateral: Secondary | ICD-10-CM | POA: Diagnosis not present

## 2024-01-21 DIAGNOSIS — E113391 Type 2 diabetes mellitus with moderate nonproliferative diabetic retinopathy without macular edema, right eye: Secondary | ICD-10-CM | POA: Diagnosis not present

## 2024-01-21 DIAGNOSIS — H3582 Retinal ischemia: Secondary | ICD-10-CM | POA: Diagnosis not present

## 2024-01-21 DIAGNOSIS — E113292 Type 2 diabetes mellitus with mild nonproliferative diabetic retinopathy without macular edema, left eye: Secondary | ICD-10-CM | POA: Diagnosis not present

## 2024-03-01 ENCOUNTER — Ambulatory Visit: Payer: PRIVATE HEALTH INSURANCE | Admitting: Family Medicine

## 2024-03-01 ENCOUNTER — Encounter: Payer: Self-pay | Admitting: Family Medicine

## 2024-03-01 VITALS — BP 146/80 | HR 98 | Ht 70.0 in | Wt 191.0 lb

## 2024-03-01 DIAGNOSIS — E1169 Type 2 diabetes mellitus with other specified complication: Secondary | ICD-10-CM

## 2024-03-01 DIAGNOSIS — Z7985 Long-term (current) use of injectable non-insulin antidiabetic drugs: Secondary | ICD-10-CM | POA: Diagnosis not present

## 2024-03-01 DIAGNOSIS — K219 Gastro-esophageal reflux disease without esophagitis: Secondary | ICD-10-CM | POA: Diagnosis not present

## 2024-03-01 DIAGNOSIS — E785 Hyperlipidemia, unspecified: Secondary | ICD-10-CM

## 2024-03-01 LAB — LIPID PANEL

## 2024-03-01 LAB — BAYER DCA HB A1C WAIVED: HB A1C (BAYER DCA - WAIVED): 7.1 % — ABNORMAL HIGH (ref 4.8–5.6)

## 2024-03-01 NOTE — Progress Notes (Signed)
 "  BP (!) 146/80   Pulse 98   Ht 5' 10 (1.778 m)   Wt 191 lb (86.6 kg)   SpO2 98%   BMI 27.41 kg/m    Subjective:   Patient ID: Craig Allison, male    DOB: [birth year], 54 y.o.   MRN: [id number]  HPI: Craig Allison is a 54 y.o. male presenting on 03/01/2024 for Medical Management of Chronic Issues and Diabetes   Discussed the use of AI scribe software for clinical note transcription with the patient, who gave verbal consent to proceed.  History of Present Illness   Craig Allison is a 54 year old male who presents with fatigue and night sweats.  Fatigue - Fatigue occurs throughout the day. - Irregular work schedule with frequent night shifts. - Goes to bed late and wakes up early, resulting in insufficient sleep. - Wakes up early regardless of bedtime. - No use of over-the-counter sleep aids or medications for fatigue.  Night sweats - Excessive sweating during sleep, with episodes resulting in wet shirt. - Night sweats began after COVID-19 vaccination five years ago. - Sweating does not occur every night.  Glycemic concerns - Does not regularly monitor blood glucose levels. - No symptoms of hypoglycemia while awake. - Concerned about possible nocturnal hypoglycemia.  Appetite and eating patterns - Irregular eating habits due to busy schedule. - Frequently skips meals and sometimes does not eat all day. - No sensation of hunger, attributed to Mounjaro  medication. - Occasional episodes of increased appetite. - Prefers quick meals such as pan pizza, consumed frequently.          Relevant past medical, surgical, family and social history reviewed and updated as indicated. Interim medical history since our last visit reviewed. Allergies and medications reviewed and updated.  Review of Systems  Constitutional:  Negative for chills and fever.  Eyes:  Negative for visual disturbance.  Respiratory:  Negative for shortness of breath and wheezing.   Cardiovascular:   Negative for chest pain and leg swelling.  Musculoskeletal:  Negative for back pain and gait problem.  Skin:  Negative for rash.  Neurological:  Negative for dizziness and light-headedness.  All other systems reviewed and are negative.   Per HPI unless specifically indicated above   Allergies as of 03/01/2024   No Known Allergies      Medication List        Accurate as of March 01, 2024  3:22 PM. If you have any questions, ask your nurse or doctor.          Mounjaro  10 MG/0.5ML Pen Generic drug: tirzepatide  Inject 10 mg into the skin once a week.   omeprazole 20 MG tablet Commonly known as: PRILOSEC OTC Take 20 mg by mouth daily.   rosuvastatin  10 MG tablet Commonly known as: Crestor  Take 1 tablet (10 mg total) by mouth every other day.         Objective:   BP (!) 146/80   Pulse 98   Ht 5' 10 (1.778 m)   Wt 191 lb (86.6 kg)   SpO2 98%   BMI 27.41 kg/m   Wt Readings from Last 3 Encounters:  03/01/24 191 lb (86.6 kg)  09/30/23 187 lb (84.8 kg)  06/30/23 186 lb (84.4 kg)    Physical Exam Vitals and nursing note reviewed.  Neck:     Thyroid: No thyroid mass, thyromegaly or thyroid tenderness.  Lymphadenopathy:     Cervical: No cervical adenopathy.  Physical Exam   CHEST: Lungs clear to auscultation bilaterally. CARDIOVASCULAR: Heart regular rate and rhythm, no murmurs.         Assessment & Plan:   Problem List Items Addressed This Visit       Digestive   GERD (gastroesophageal reflux disease)     Endocrine   Type 2 diabetes mellitus with other specified complication (HCC)   Relevant Orders   Microalbumin / creatinine urine ratio   Hyperlipidemia associated with type 2 diabetes mellitus (HCC) - Primary   Relevant Orders   CBC with Differential/Platelet   CMP14+EGFR   Lipid panel   TSH   Bayer DCA Hb A1c Waived       Type 2 diabetes mellitus with other specified complication Type 2 diabetes with complications including  nocturnal hyperhidrosis possibly due to hypoglycemia. Inconsistent glucose monitoring and missed meals may contribute. Mounjaro  may affect appetite. Previous HbA1c 7.5%. - Check blood glucose during nocturnal hyperhidrosis to rule out hypoglycemia. - Monitor dietary intake, focusing on carbohydrates. - Consider increasing Mounjaro  dosage if HbA1c is elevated.      A1c was actually slightly improved at 7.1 so we will keep the medicine the same with the Mounjaro .  Just monitor at at night if he has those sweats to make sure that he is not getting hypoglycemia.  Consider sleep study if he still has nighttime problems as well.    Follow up plan: Return in about 3 months (around 05/30/2024), or if symptoms worsen or fail to improve, for Diabetes recheck.  Counseling provided for all of the vaccine components Orders Placed This Encounter  Procedures   CBC with Differential/Platelet   CMP14+EGFR   Lipid panel   TSH   Bayer DCA Hb A1c Waived   Microalbumin / creatinine urine ratio    Fonda Levins, MD [facility] 03/01/2024, 3:22 PM     "

## 2024-03-02 LAB — CMP14+EGFR
ALT: 27 IU/L (ref 0–44)
AST: 17 IU/L (ref 0–40)
Albumin: 4.4 g/dL (ref 3.8–4.9)
Alkaline Phosphatase: 49 IU/L (ref 47–123)
BUN/Creatinine Ratio: 14 (ref 9–20)
BUN: 15 mg/dL (ref 6–24)
Bilirubin Total: 0.4 mg/dL (ref 0.0–1.2)
CO2: 22 mmol/L (ref 20–29)
Calcium: 10.4 mg/dL — AB (ref 8.7–10.2)
Chloride: 100 mmol/L (ref 96–106)
Creatinine, Ser: 1.04 mg/dL (ref 0.76–1.27)
Globulin, Total: 2.5 g/dL (ref 1.5–4.5)
Glucose: 198 mg/dL — AB (ref 70–99)
Potassium: 4.3 mmol/L (ref 3.5–5.2)
Sodium: 139 mmol/L (ref 134–144)
Total Protein: 6.9 g/dL (ref 6.0–8.5)
eGFR: 86 mL/min/1.73

## 2024-03-02 LAB — CBC WITH DIFFERENTIAL/PLATELET
Basophils Absolute: 0.1 x10E3/uL (ref 0.0–0.2)
Basos: 1 %
EOS (ABSOLUTE): 0.2 x10E3/uL (ref 0.0–0.4)
Eos: 3 %
Hematocrit: 47.7 % (ref 37.5–51.0)
Hemoglobin: 16.4 g/dL (ref 13.0–17.7)
Immature Grans (Abs): 0 x10E3/uL (ref 0.0–0.1)
Immature Granulocytes: 0 %
Lymphocytes Absolute: 2.3 x10E3/uL (ref 0.7–3.1)
Lymphs: 41 %
MCH: 31 pg (ref 26.6–33.0)
MCHC: 34.4 g/dL (ref 31.5–35.7)
MCV: 90 fL (ref 79–97)
Monocytes Absolute: 0.4 x10E3/uL (ref 0.1–0.9)
Monocytes: 7 %
Neutrophils Absolute: 2.7 x10E3/uL (ref 1.4–7.0)
Neutrophils: 48 %
Platelets: 173 x10E3/uL (ref 150–450)
RBC: 5.29 x10E6/uL (ref 4.14–5.80)
RDW: 12.2 % (ref 11.6–15.4)
WBC: 5.6 x10E3/uL (ref 3.4–10.8)

## 2024-03-02 LAB — LIPID PANEL
Cholesterol, Total: 213 mg/dL — AB (ref 100–199)
HDL: 36 mg/dL — AB
LDL CALC COMMENT:: 5.9 ratio — AB (ref 0.0–5.0)
LDL Chol Calc (NIH): 131 mg/dL — AB (ref 0–99)
Triglycerides: 256 mg/dL — AB (ref 0–149)
VLDL Cholesterol Cal: 46 mg/dL — AB (ref 5–40)

## 2024-03-02 LAB — TSH: TSH: 1.6 u[IU]/mL (ref 0.450–4.500)

## 2024-03-08 ENCOUNTER — Ambulatory Visit: Payer: Self-pay | Admitting: Family Medicine

## 2024-03-08 NOTE — Telephone Encounter (Unsigned)
 Copied from CRM #[id number]. Topic: Clinical - Lab/Test Results >> Mar 08, 2024  4:49 PM Graeme ORN wrote: Reason for CRM: Patient returned missed call for labs. Read note as written by provider. Patient understood. No Further questions at this time. Thank You.

## 2024-05-31 ENCOUNTER — Ambulatory Visit: Payer: PRIVATE HEALTH INSURANCE | Admitting: Family Medicine
